# Patient Record
Sex: Female | Born: 1935 | Race: White | Hispanic: No | Smoking: Former smoker
Health system: Southern US, Community
[De-identification: ages and names within clinical notes are randomized; demographics above are authoritative.]

## PROBLEM LIST (undated history)

## (undated) DIAGNOSIS — J449 Chronic obstructive pulmonary disease, unspecified: Secondary | ICD-10-CM

## (undated) DIAGNOSIS — E7800 Pure hypercholesterolemia, unspecified: Secondary | ICD-10-CM

## (undated) DIAGNOSIS — F419 Anxiety disorder, unspecified: Secondary | ICD-10-CM

## (undated) DIAGNOSIS — M1990 Unspecified osteoarthritis, unspecified site: Secondary | ICD-10-CM

## (undated) DIAGNOSIS — F0280 Dementia in other diseases classified elsewhere without behavioral disturbance: Secondary | ICD-10-CM

## (undated) DIAGNOSIS — G309 Alzheimer's disease, unspecified: Secondary | ICD-10-CM

## (undated) HISTORY — PX: BLADDER REPAIR: SHX76

---

## 2016-06-03 ENCOUNTER — Other Ambulatory Visit (HOSPITAL_COMMUNITY): Payer: Self-pay | Admitting: Pulmonary Disease

## 2016-06-03 DIAGNOSIS — R413 Other amnesia: Secondary | ICD-10-CM

## 2016-06-05 ENCOUNTER — Encounter (HOSPITAL_COMMUNITY): Payer: Self-pay | Admitting: Radiology

## 2016-06-05 ENCOUNTER — Ambulatory Visit (HOSPITAL_COMMUNITY)
Admission: RE | Admit: 2016-06-05 | Discharge: 2016-06-05 | Disposition: A | Discharge status: Home / Self Care | Payer: Medicare PPO | Source: Ambulatory Visit | Attending: Pulmonary Disease | Admitting: Pulmonary Disease

## 2016-06-05 DIAGNOSIS — Q048 Other specified congenital malformations of brain: Secondary | ICD-10-CM | POA: Insufficient documentation

## 2016-06-05 DIAGNOSIS — R413 Other amnesia: Secondary | ICD-10-CM

## 2016-06-05 DIAGNOSIS — G319 Degenerative disease of nervous system, unspecified: Secondary | ICD-10-CM | POA: Insufficient documentation

## 2016-06-05 DIAGNOSIS — R930 Abnormal findings on diagnostic imaging of skull and head, not elsewhere classified: Secondary | ICD-10-CM | POA: Insufficient documentation

## 2016-07-12 ENCOUNTER — Emergency Department (HOSPITAL_COMMUNITY): Payer: Medicare PPO

## 2016-07-12 ENCOUNTER — Encounter (HOSPITAL_COMMUNITY): Payer: Self-pay | Admitting: Emergency Medicine

## 2016-07-12 ENCOUNTER — Inpatient Hospital Stay (HOSPITAL_COMMUNITY)
Admission: EM | Admit: 2016-07-12 | Discharge: 2016-07-16 | DRG: 193 | Disposition: A | Discharge status: Home Health | Payer: Medicare PPO | Attending: Pulmonary Disease | Admitting: Pulmonary Disease

## 2016-07-12 DIAGNOSIS — M8448XA Pathological fracture, other site, initial encounter for fracture: Secondary | ICD-10-CM | POA: Diagnosis not present

## 2016-07-12 DIAGNOSIS — J9601 Acute respiratory failure with hypoxia: Secondary | ICD-10-CM | POA: Diagnosis present

## 2016-07-12 DIAGNOSIS — F419 Anxiety disorder, unspecified: Secondary | ICD-10-CM | POA: Diagnosis present

## 2016-07-12 DIAGNOSIS — J189 Pneumonia, unspecified organism: Secondary | ICD-10-CM

## 2016-07-12 DIAGNOSIS — M1990 Unspecified osteoarthritis, unspecified site: Secondary | ICD-10-CM | POA: Diagnosis present

## 2016-07-12 DIAGNOSIS — F0280 Dementia in other diseases classified elsewhere without behavioral disturbance: Secondary | ICD-10-CM | POA: Diagnosis present

## 2016-07-12 DIAGNOSIS — J440 Chronic obstructive pulmonary disease with acute lower respiratory infection: Secondary | ICD-10-CM | POA: Diagnosis present

## 2016-07-12 DIAGNOSIS — M8438XA Stress fracture, other site, initial encounter for fracture: Secondary | ICD-10-CM | POA: Diagnosis present

## 2016-07-12 DIAGNOSIS — Z66 Do not resuscitate: Secondary | ICD-10-CM | POA: Diagnosis present

## 2016-07-12 DIAGNOSIS — R64 Cachexia: Secondary | ICD-10-CM | POA: Diagnosis present

## 2016-07-12 DIAGNOSIS — R262 Difficulty in walking, not elsewhere classified: Secondary | ICD-10-CM

## 2016-07-12 DIAGNOSIS — Z87891 Personal history of nicotine dependence: Secondary | ICD-10-CM | POA: Diagnosis not present

## 2016-07-12 DIAGNOSIS — Z79899 Other long term (current) drug therapy: Secondary | ICD-10-CM | POA: Diagnosis not present

## 2016-07-12 DIAGNOSIS — E785 Hyperlipidemia, unspecified: Secondary | ICD-10-CM | POA: Diagnosis present

## 2016-07-12 DIAGNOSIS — M25551 Pain in right hip: Secondary | ICD-10-CM | POA: Diagnosis present

## 2016-07-12 DIAGNOSIS — R52 Pain, unspecified: Secondary | ICD-10-CM

## 2016-07-12 DIAGNOSIS — R05 Cough: Secondary | ICD-10-CM

## 2016-07-12 DIAGNOSIS — R059 Cough, unspecified: Secondary | ICD-10-CM

## 2016-07-12 DIAGNOSIS — Z681 Body mass index (BMI) 19 or less, adult: Secondary | ICD-10-CM

## 2016-07-12 DIAGNOSIS — E43 Unspecified severe protein-calorie malnutrition: Secondary | ICD-10-CM | POA: Insufficient documentation

## 2016-07-12 DIAGNOSIS — G309 Alzheimer's disease, unspecified: Secondary | ICD-10-CM | POA: Diagnosis present

## 2016-07-12 HISTORY — DX: Unspecified osteoarthritis, unspecified site: M19.90

## 2016-07-12 HISTORY — DX: Anxiety disorder, unspecified: F41.9

## 2016-07-12 HISTORY — DX: Dementia in other diseases classified elsewhere, unspecified severity, without behavioral disturbance, psychotic disturbance, mood disturbance, and anxiety: F02.80

## 2016-07-12 HISTORY — DX: Alzheimer's disease, unspecified: G30.9

## 2016-07-12 HISTORY — DX: Pure hypercholesterolemia, unspecified: E78.00

## 2016-07-12 LAB — CBC WITH DIFFERENTIAL/PLATELET
Basophils Absolute: 0 10*3/uL (ref 0.0–0.1)
Basophils Relative: 0 %
Eosinophils Absolute: 0.3 10*3/uL (ref 0.0–0.7)
Eosinophils Relative: 2 %
HCT: 35 % — ABNORMAL LOW (ref 36.0–46.0)
Hemoglobin: 11.3 g/dL — ABNORMAL LOW (ref 12.0–15.0)
Lymphocytes Relative: 21 %
Lymphs Abs: 2.6 10*3/uL (ref 0.7–4.0)
MCH: 30.4 pg (ref 26.0–34.0)
MCHC: 32.3 g/dL (ref 30.0–36.0)
MCV: 94.1 fL (ref 78.0–100.0)
Monocytes Absolute: 1 10*3/uL (ref 0.1–1.0)
Monocytes Relative: 8 %
Neutro Abs: 8.6 10*3/uL — ABNORMAL HIGH (ref 1.7–7.7)
Neutrophils Relative %: 69 %
Platelets: 342 10*3/uL (ref 150–400)
RBC: 3.72 MIL/uL — ABNORMAL LOW (ref 3.87–5.11)
RDW: 14.7 % (ref 11.5–15.5)
WBC: 12.4 10*3/uL — ABNORMAL HIGH (ref 4.0–10.5)

## 2016-07-12 LAB — BASIC METABOLIC PANEL
Anion gap: 8 (ref 5–15)
BUN: 32 mg/dL — ABNORMAL HIGH (ref 6–20)
CO2: 28 mmol/L (ref 22–32)
Calcium: 8.6 mg/dL — ABNORMAL LOW (ref 8.9–10.3)
Chloride: 102 mmol/L (ref 101–111)
Creatinine, Ser: 1.13 mg/dL — ABNORMAL HIGH (ref 0.44–1.00)
GFR calc Af Amer: 53 mL/min — ABNORMAL LOW (ref >60)
GFR calc non Af Amer: 45 mL/min — ABNORMAL LOW (ref >60)
Glucose, Bld: 107 mg/dL — ABNORMAL HIGH (ref 65–99)
Potassium: 3.5 mmol/L (ref 3.5–5.1)
Sodium: 138 mmol/L (ref 135–145)

## 2016-07-12 LAB — TROPONIN I: Troponin I: <0.03 ng/mL (ref <0.03)

## 2016-07-12 LAB — BRAIN NATRIURETIC PEPTIDE: B Natriuretic Peptide: 89 pg/mL (ref 0.0–100.0)

## 2016-07-12 MED ORDER — MORPHINE SULFATE (PF) 2 MG/ML IV SOLN
INTRAVENOUS | Status: AC
  Administered 2016-07-12: 2 mg via INTRAVENOUS
  Filled 2016-07-12: qty 1

## 2016-07-12 MED ORDER — MORPHINE SULFATE (PF) 4 MG/ML IV SOLN: 2.0000 mg | Freq: Once | INTRAVENOUS | Status: DC

## 2016-07-12 MED ORDER — DEXTROSE 5 % IV SOLN
500.0000 mg | INTRAVENOUS | Status: DC
  Administered 2016-07-12: 500 mg via INTRAVENOUS
  Filled 2016-07-12: qty 500

## 2016-07-12 MED ORDER — ENOXAPARIN SODIUM 30 MG/0.3ML ~~LOC~~ SOLN
30.0000 mg | SUBCUTANEOUS | Status: DC
  Administered 2016-07-13 – 2016-07-16 (×4): 30 mg via SUBCUTANEOUS
  Filled 2016-07-12 (×4): qty 0.3

## 2016-07-12 MED ORDER — ALBUTEROL SULFATE (2.5 MG/3ML) 0.083% IN NEBU: 2.5000 mg | INHALATION_SOLUTION | RESPIRATORY_TRACT | Status: DC | PRN

## 2016-07-12 MED ORDER — DEXTROSE 5 % IV SOLN
1.0000 g | Freq: Once | INTRAVENOUS | Status: AC
  Administered 2016-07-12: 1 g via INTRAVENOUS
  Filled 2016-07-12: qty 10

## 2016-07-12 MED ORDER — LACTATED RINGERS IV SOLN
INTRAVENOUS | Status: DC
  Administered 2016-07-13 – 2016-07-16 (×5): via INTRAVENOUS

## 2016-07-12 MED ORDER — DEXTROSE 5 % IV SOLN
1.0000 g | INTRAVENOUS | Status: DC
  Administered 2016-07-13 – 2016-07-15 (×3): 1 g via INTRAVENOUS
  Filled 2016-07-12 (×4): qty 10

## 2016-07-12 MED ORDER — HYDROCODONE-ACETAMINOPHEN 5-325 MG PO TABS
1.0000 | ORAL_TABLET | Freq: Four times a day (QID) | ORAL | Status: DC
  Administered 2016-07-13 – 2016-07-16 (×12): 1 via ORAL
  Filled 2016-07-12 (×14): qty 1

## 2016-07-12 MED ORDER — AZITHROMYCIN 250 MG PO TABS
500.0000 mg | ORAL_TABLET | ORAL | Status: DC
  Administered 2016-07-13 – 2016-07-16 (×4): 500 mg via ORAL
  Filled 2016-07-12 (×4): qty 2

## 2016-07-12 MED ORDER — MIRTAZAPINE 15 MG PO TABS
7.5000 mg | ORAL_TABLET | Freq: Every day | ORAL | Status: DC
  Administered 2016-07-13 – 2016-07-15 (×3): 7.5 mg via ORAL
  Filled 2016-07-12 (×4): qty 1

## 2016-07-12 MED ORDER — METHOCARBAMOL 500 MG PO TABS: 500.0000 mg | ORAL_TABLET | Freq: Four times a day (QID) | ORAL | Status: DC | PRN

## 2016-07-12 MED ORDER — DONEPEZIL HCL 5 MG PO TABS
5.0000 mg | ORAL_TABLET | Freq: Every day | ORAL | Status: DC
  Administered 2016-07-13 – 2016-07-16 (×4): 5 mg via ORAL
  Filled 2016-07-12 (×4): qty 1

## 2016-07-12 MED ORDER — IPRATROPIUM-ALBUTEROL 0.5-2.5 (3) MG/3ML IN SOLN
3.0000 mL | Freq: Once | RESPIRATORY_TRACT | Status: AC
  Administered 2016-07-12: 3 mL via RESPIRATORY_TRACT
  Filled 2016-07-12: qty 3

## 2016-07-12 MED ORDER — ALPRAZOLAM 0.5 MG PO TABS
0.5000 mg | ORAL_TABLET | Freq: Four times a day (QID) | ORAL | Status: DC
  Administered 2016-07-13 – 2016-07-14 (×4): 0.5 mg via ORAL
  Filled 2016-07-12 (×6): qty 1

## 2016-07-12 MED ORDER — ATORVASTATIN CALCIUM 20 MG PO TABS
20.0000 mg | ORAL_TABLET | Freq: Every day | ORAL | Status: DC
  Administered 2016-07-13 – 2016-07-15 (×3): 20 mg via ORAL
  Filled 2016-07-12 (×3): qty 1

## 2016-07-12 MED ORDER — IPRATROPIUM-ALBUTEROL 0.5-2.5 (3) MG/3ML IN SOLN
3.0000 mL | Freq: Four times a day (QID) | RESPIRATORY_TRACT | Status: DC
  Administered 2016-07-13 – 2016-07-14 (×5): 3 mL via RESPIRATORY_TRACT
  Filled 2016-07-12 (×5): qty 3

## 2016-07-12 NOTE — ED Provider Notes (Signed)
[facility]-EM[facility]Y DEPT Provider Note   CSN: [id number] Arrival date & time: 07/12/16  1707  By signing my name below, I, Rosario Adie, attest that this documentation has been prepared under the direction and in the presence of Raeford Razor, MD. Electronically Signed: Rosario Adie, ED Scribe. 07/12/16. 6:07 PM.  History   Chief Complaint Chief Complaint  Patient presents with  . Weakness  . Nasal Congestion   The history is provided by the patient. No language interpreter was used.    HPI Comments: Melissa Davila is a 81 y.o. female with a h/o alzheimer's dementia and arthritis, who presents to the Emergency Department complaining of persistent, gradually worsening productive cough w/ green sputum onset approximately one week ago. Pt reports associated shortness of breath, chest congestion, loss of appetite, and generalized weakness secondary to her worsening cough. No noted treatments for her symptoms were tried prior to coming into the ED. Pt is not O2 dependent at home. Pt is a former smoker, and family notes that she quit ~1 year ago. Pt denies chest pain, fever, leg swelling, urgency, frequency, hematuria, dysuria, difficulty urinating, or any other associated symptoms.   Family additionally states that the pt has been c/o gradually worsening right hip pain over the past three days as well. No recent trauma or falls. Pt has been minimally ambulatory since secondary to it exacerbating her pain.   PCP: Fredirick Maudlin, MD  Past Medical History:  Diagnosis Date  . Alzheimer's dementia   . Anxiety   . Arthritis   . Hypercholesterolemia    There are no active problems to display for this patient.  Past Surgical History:  Procedure Laterality Date  . BLADDER REPAIR     OB History    No data available     Home Medications    Prior to Admission medications   Not on File   Family History History reviewed. No pertinent family history.  Social History Social  History  Substance Use Topics  . Smoking status: Former Games developer  . Smokeless tobacco: Never Used  . Alcohol use No   Allergies   Patient has no allergy information on record.  Review of Systems Review of Systems  Constitutional: Positive for appetite change. Negative for fever.  HENT: Positive for congestion.   Respiratory: Positive for cough and shortness of breath.   Cardiovascular: Negative for chest pain and leg swelling.  Genitourinary: Negative for difficulty urinating, dysuria, frequency, hematuria and urgency.  Musculoskeletal: Positive for arthralgias (right hip).  Neurological: Positive for weakness (generalized).  All other systems reviewed and are negative.  Physical Exam Updated Vital Signs BP 126/56   Pulse 92   Temp 98.3 F (36.8 C) (Oral)   Resp 23   Ht 5\' 6"  (1.676 m)   Wt 86 lb (39 kg)   LMP  (LMP Unknown)   SpO2 91%   BMI 13.88 kg/m   Physical Exam  Constitutional:  Cachectic. Chronically ill appearing.   HENT:  Head: Normocephalic.  Right Ear: External ear normal.  Left Ear: External ear normal.  Nose: Nose normal.  Mouth/Throat: Oropharynx is clear and moist.  Eyes: Conjunctivae are normal. Right eye exhibits no discharge. Left eye exhibits no discharge.  Neck: Normal range of motion.  Cardiovascular: Normal rate, regular rhythm and normal heart sounds.   No murmur heard. Pulmonary/Chest: Effort normal. No respiratory distress. She has no wheezes. She has rhonchi.  Wet sounding cough on exam. Bilateral rhonchi is noted, R>L.  Abdominal: Soft. She exhibits no distension. There is no tenderness. There is no rebound and no guarding.  Musculoskeletal: She exhibits tenderness. She exhibits no edema or deformity.  RLE: Holds RLE with her hip slightly flexed. TTP over greater trochanter. No obvious deformity. Increased pain on ROM. Neurovascularly intact.   Neurological: She is alert. No cranial nerve deficit. Coordination normal.  Skin: Skin is warm  and dry. No rash noted. No erythema.  Psychiatric: She has a normal mood and affect. Her behavior is normal.  Nursing note and vitals reviewed.  ED Treatments / Results  DIAGNOSTIC STUDIES: Oxygen Saturation is 91% on 2LO2NC, low by my interpretation.   COORDINATION OF CARE: 6:04 PM-Discussed next steps with pt and family. Pt and family verbalized understanding and is agreeable with the plan.   Labs (all labs ordered are listed, but only abnormal results are displayed) Labs Reviewed  CBC WITH DIFFERENTIAL/PLATELET - Abnormal; Notable for the following:       Result Value   WBC 12.4 (*)    RBC 3.72 (*)    Hemoglobin 11.3 (*)    HCT 35.0 (*)    Neutro Abs 8.6 (*)    All other components within normal limits  BASIC METABOLIC PANEL - Abnormal; Notable for the following:    Glucose, Bld 107 (*)    BUN 32 (*)    Creatinine, Ser 1.13 (*)    Calcium 8.6 (*)    GFR calc non Af Amer 45 (*)    GFR calc Af Amer 53 (*)    All other components within normal limits  SEDIMENTATION RATE - Abnormal; Notable for the following:    Sed Rate 78 (*)    All other components within normal limits  C-REACTIVE PROTEIN - Abnormal; Notable for the following:    CRP 16.5 (*)    All other components within normal limits  BASIC METABOLIC PANEL - Abnormal; Notable for the following:    Potassium 3.2 (*)    Glucose, Bld 138 (*)    BUN 26 (*)    Calcium 7.9 (*)    GFR calc non Af Amer 58 (*)    All other components within normal limits  CBC WITH DIFFERENTIAL/PLATELET - Abnormal; Notable for the following:    RBC 3.18 (*)    Hemoglobin 9.7 (*)    HCT 30.1 (*)    All other components within normal limits  CULTURE, BLOOD (ROUTINE X 2)  CULTURE, BLOOD (ROUTINE X 2)  BRAIN NATRIURETIC PEPTIDE  TROPONIN I  STREP PNEUMONIAE URINARY ANTIGEN  URINALYSIS, ROUTINE W REFLEX MICROSCOPIC    EKG  EKG Interpretation  Date/Time:  Sunday July 12 2016 18:10:01 EST Ventricular Rate:  92 PR Interval:      QRS Duration: 77 QT Interval:  334 QTC Calculation: 414 R Axis:   64 Text Interpretation:  Sinus rhythm Prolonged PR interval Consider left atrial enlargement Anterior infarct, old Confirmed by Juleen China  MD, Roena Sassaman ([id number]) on 07/12/2016 6:40:16 PM       Radiology No results found.   Dg Chest 1 View  Result Date: 07/12/2016 CLINICAL DATA:  Productive cough and congestion for 1 week. EXAM: CHEST 1 VIEW COMPARISON:  None. FINDINGS: There is marked hyperinflation. There is patchy opacity in the left base which may represent infectious infiltrate. There is severe emphysematous and fibrotic disease throughout both lungs. No large effusion. Hilar and mediastinal contours are unremarkable. Pulmonary vasculature is normal. No pneumothorax. IMPRESSION: Patchy airspace opacity in the left base may  represent pneumonia superimposed on severe COPD. Followup PA and lateral chest X-ray is recommended in 3-4 weeks following trial of antibiotic therapy to ensure resolution and exclude underlying malignancy. Electronically Signed   By: Ellery Plunkaniel R Mitchell M.D.   On: 07/12/2016 20:31   Mr Hip Right Wo Contrast  Result Date: 07/13/2016 CLINICAL DATA:  Right hip pain.  No known injury. EXAM: MR OF THE RIGHT HIP WITHOUT CONTRAST TECHNIQUE: Multiplanar, multisequence MR imaging was performed. No intravenous contrast was administered. COMPARISON:  Radiographs dated 07/12/2016 FINDINGS: Bones: There is a subtle stress fracture/reaction in the right sacral ala adjacent to the SI joint. The bones of the right hip are normal. Articular cartilage and labrum Articular cartilage:  Diffuse thinning of the articular cartilage. Labrum:  Intact. Joint or bursal effusion Joint effusion:  None Bursae:  Normal Muscles and tendons Muscles and tendons: Slight edema in the adductor muscles bilaterally adjacent to the symphysis pubis. This is symmetrical. Other findings Miscellaneous: No adenopathy or mass lesions. Diverticulosis of the distal  colon. IMPRESSION: 1. Subtle stress fracture/reaction of the right sacral ala. 2. Nonspecific edema and adductor muscles adjacent to the symphysis pubis bilaterally. Electronically Signed   By: Francene BoyersJames  Maxwell M.D.   On: 07/13/2016 12:16   Dg Hip Unilat With Pelvis 2-3 Views Right  Result Date: 07/12/2016 CLINICAL DATA:  81 y/o  F; 3 days of right hip pain without trauma. EXAM: DG HIP (WITH OR WITHOUT PELVIS) 2-3V RIGHT COMPARISON:  None. FINDINGS: Bones are demineralized. Mild osteoarthrosis of hip joints with productive changes of the acetabulum. The no acute fracture or dislocation is identified. Aortic and bilateral iliofemoral atherosclerosis with extensive calcification. IMPRESSION: No acute fracture or dislocation identified. Electronically Signed   By: Mitzi HansenLance  Furusawa-Stratton M.D.   On: 07/12/2016 20:33   Procedures Procedures   Medications Ordered in ED Medications - No data to display  Initial Impression / Assessment and Plan / ED Course  I have reviewed the triage vital signs and the nursing notes.  Pertinent labs & imaging results that were available during my care of the patient were reviewed by me and considered in my medical decision making (see chart for details).     Final Clinical Impressions(s) / ED Diagnoses   Final diagnoses:  Community acquired pneumonia of left lung, unspecified part of lung  Hip pain, acute, right    New Prescriptions New Prescriptions   No medications on file   I personally preformed the services scribed in my presence. The recorded information has been reviewed is accurate. Raeford RazorStephen Livana Yerian, MD.    Raeford RazorStephen Madden Garron, MD 07/22/16 [phone]

## 2016-07-12 NOTE — ED Triage Notes (Signed)
Congestion with cough x 1 week, green sputum. r hip pain x 3 days, denies fall. Lives with daughter who says pt has beginning stages of alzheimer's. Pt denies fall.

## 2016-07-12 NOTE — ED Notes (Signed)
Report given to Delene RuffiniSilivia, RN on Dept 300, all questions answered

## 2016-07-12 NOTE — ED Notes (Signed)
Resp paged for breathing treatment.  

## 2016-07-12 NOTE — H&P (Addendum)
History and Physical    [name] YKZ:[id number] DOB: Jan 26, 1936 DOA: 07/12/2016  PCP: Alonza Bogus, MD Consultants:  None Patient coming from: Home - lives with daughter, son-in-law, and granddaughter; Melissa Davila: daughter, [phone]  Chief Complaint: hip pain, cough  HPI: Melissa Davila is a 81 y.o. female with medical history significant of HLD, mild dementia presenting with right hip pain.  She reports that her daughter made her come.  "Feel real bad, just don't feel well."  5 days of fatigue, difficulty "going".  Right leg pain.  Early dementia.  +cough, nonproductive.  No fever.  No other URI symptoms.  Just very inactive since Thursday.  Hip pain since then.  Just generally immobile, only up to go to restroom twice a day since Friday.  Unable to stand at all.  Washed dishes Thursday afternoon and was in her usual state of health.  No apparent injury, no fall.  Just acute onset of inability to move R hip, but able to move foot.     ED Course: Given Duoneb, morphine, Rocephin, Azithromycin  Review of Systems: As per HPI; otherwise 10 point review of systems reviewed and negative.   Ambulatory Status:  Able to ambulate with rolling walker Thursday without difficulty  Past Medical History:  Diagnosis Date  . Alzheimer's dementia   . Anxiety   . Arthritis   . Hypercholesterolemia     Past Surgical History:  Procedure Laterality Date  . BLADDER REPAIR      Social History   Social History  . Marital status: Divorced    Spouse name: N/A  . Number of children: N/A  . Years of education: N/A   Occupational History  . retired    Social History Main Topics  . Smoking status: Former Smoker    Packs/day: 1.00    Years: 50.00    Quit date: 06/2015  . Smokeless tobacco: Never Used  . Alcohol use No  . Drug use: No  . Sexual activity: Not on file   Other Topics Concern  . Not on file   Social History Narrative  . No narrative on file    Not on File  History  reviewed. No pertinent family history.  Prior to Admission medications   Medication Sig Start Date End Date Taking? Authorizing Provider  ALPRAZolam Duanne Moron) 0.5 MG tablet Take 1 tablet by mouth 4 (four) times daily. 06/17/16  Yes Historical Provider, MD  atorvastatin (LIPITOR) 20 MG tablet Take 1 tablet by mouth daily at 6 PM. 06/23/16  Yes Historical Provider, MD  donepezil (ARICEPT) 5 MG tablet Take 1 tablet by mouth daily. 07/10/16  Yes Historical Provider, MD  HYDROcodone-acetaminophen (NORCO/VICODIN) 5-325 MG tablet Take 1 tablet by mouth every 6 (six) hours. 06/17/16  Yes Historical Provider, MD  mirtazapine (REMERON) 7.5 MG tablet Take 1 tablet by mouth daily. 07/10/16  Yes Historical Provider, MD    Physical Exam: Vitals:   07/12/16 1923 07/12/16 1930 07/12/16 2137 07/12/16 2317  BP:  113/67 132/94 (!) 117/46  Pulse:  79 85 85  Resp:  _0 Temp:   98.1 F (36.7 C) 99.5 F (37.5 C)  TempSrc:   Oral Oral  SpO2: 97% 98% 94% 94%  Weight:    36.5 kg (80 lb 7.5 oz)  Height:    _1  (1.676 m)     General: Appears calm and comfortable and is NAD, quite frail and cachectic Eyes:  PERRL, EOMI, normal lids, iris ENT:  grossly  normal hearing, lips & tongue, mmm Neck:  no LAD, masses or thyromegaly Cardiovascular:  RRR, no m/r/g. No LE edema.  Respiratory:  CTA bilaterally, no w/r/r. Normal respiratory effort. Abdomen:  soft, ntnd, NABS Skin:  no rash or induration seen on limited exam Musculoskeletal:  grossly normal tone BUE/BLE, no bony abnormality, marked tenderness with movement (no apparent joint involvement) in left gluteal region Psychiatric:  grossly normal mood and affect, speech fluent and appropriate, AOx3 Neurologic:  CN 2-12 grossly intact, moves all extremities in coordinated fashion, sensation intact  Labs on Admission: I have personally reviewed following labs and imaging studies  CBC:  Recent Labs Lab 07/12/16 1812  WBC 12.4*  NEUTROABS 8.6*  HGB 11.3*  HCT  35.0*  MCV 94.1  PLT 161   Basic Metabolic Panel:  Recent Labs Lab 07/12/16 1812  NA 138  K 3.5  CL 102  CO2 28  GLUCOSE 107*  BUN 32*  CREATININE 1.13*  CALCIUM 8.6*   GFR: Estimated Creatinine Clearance: 23.6 mL/min (by C-G formula based on SCr of 1.13 mg/dL (H)). Liver Function Tests: No results for input(s): AST, ALT, ALKPHOS, BILITOT, PROT, ALBUMIN in the last 168 hours. No results for input(s): LIPASE, AMYLASE in the last 168 hours. No results for input(s): AMMONIA in the last 168 hours. Coagulation Profile: No results for input(s): INR, PROTIME in the last 168 hours. Cardiac Enzymes:  Recent Labs Lab 07/12/16 1812  TROPONINI <0.03   BNP (last 3 results) No results for input(s): PROBNP in the last 8760 hours. HbA1C: No results for input(s): HGBA1C in the last 72 hours. CBG: No results for input(s): GLUCAP in the last 168 hours. Lipid Profile: No results for input(s): CHOL, HDL, LDLCALC, TRIG, CHOLHDL, LDLDIRECT in the last 72 hours. Thyroid Function Tests: No results for input(s): TSH, T4TOTAL, FREET4, T3FREE, THYROIDAB in the last 72 hours. Anemia Panel: No results for input(s): VITAMINB12, FOLATE, FERRITIN, TIBC, IRON, RETICCTPCT in the last 72 hours. Urine analysis: No results found for: COLORURINE, APPEARANCEUR, LABSPEC, PHURINE, GLUCOSEU, HGBUR, BILIRUBINUR, KETONESUR, PROTEINUR, UROBILINOGEN, NITRITE, LEUKOCYTESUR  Creatinine Clearance: Estimated Creatinine Clearance: 23.6 mL/min (by C-G formula based on SCr of 1.13 mg/dL (H)).  Sepsis Labs: _0 (procalcitonin:4,lacticidven:4) )No results found for this or any previous visit (from the past 240 hour(s)).   Radiological Exams on Admission: Dg Chest 1 View  Result Date: 07/12/2016 CLINICAL DATA:  Productive cough and congestion for 1 week. EXAM: CHEST 1 VIEW COMPARISON:  None. FINDINGS: There is marked hyperinflation. There is patchy opacity in the left base which may represent infectious  infiltrate. There is severe emphysematous and fibrotic disease throughout both lungs. No large effusion. Hilar and mediastinal contours are unremarkable. Pulmonary vasculature is normal. No pneumothorax. IMPRESSION: Patchy airspace opacity in the left base may represent pneumonia superimposed on severe COPD. Followup PA and lateral chest X-ray is recommended in 3-4 weeks following trial of antibiotic therapy to ensure resolution and exclude underlying malignancy. Electronically Signed   By: Andreas Newport M.D.   On: 07/12/2016 20:31   Dg Hip Unilat With Pelvis 2-3 Views Right  Result Date: 07/12/2016 CLINICAL DATA:  81 y/o  F; 3 days of right hip pain without trauma. EXAM: DG HIP (WITH OR WITHOUT PELVIS) 2-3V RIGHT COMPARISON:  None. FINDINGS: Bones are demineralized. Mild osteoarthrosis of hip joints with productive changes of the acetabulum. The no acute fracture or dislocation is identified. Aortic and bilateral iliofemoral atherosclerosis with extensive calcification. IMPRESSION: No acute fracture or dislocation identified. Electronically Signed  By: Kristine Garbe M.D.   On: 07/12/2016 20:33    EKG: Independently reviewed.  NSR with rate 92; nonspecific ST changes with no evidence of acute ischemia  Assessment/Plan Principal Problem:   Hip pain, acute, right Active Problems:   CAP (community acquired pneumonia)   Hip pain -Patient with acute onset of right hip pain since Thursday night -Had been her usual self and had washed dishes on Thursday without incident prior to onset of pain -Denies injury or fall -Negative x-ray -Exam appears to show no TTP but pain with movement in the right gluteal region -Suspect MSK strain -Will treat with morphine and Robaxin prn -PT consult -Check ESR, CRP due to acute onset of severe R hip pain.  CAP -Prolonged smoking history -Also with incidental cough -CXR with possible PNA -Will continue Azithromycin 500 mg PO daily AND  Rocephin. -LR @ 75cc/hr - Repeat CBC in am (WBC 12.4) - Sputum cultures - Blood cultures - albuterol PRN - Duonebs q6h    -Creatinine 1.13, unknown baseline.  Little muscle mass.  Will follow. -Hgb 11.3, unknown baseline. -Cachectic, will order nutrition consult.  DVT prophylaxis:   Lovenox  Code Status: DNR - confirmed with patient/family Family Communication: Daughters present throughout evaluation Disposition Plan:  Home once clinically improved Consults called: PT, Nutrition Admission status: Admit - It is my clinical opinion that admission to INPATIENT is reasonable and necessary because this patient will require at least 2 midnights in the hospital to treat this condition based on the medical complexity of the problems presented.  Given the aforementioned information, the predictability of an adverse outcome is felt to be significant.     Karmen Bongo MD [facility]  If 7PM-7AM, please contact night-coverage [email or web address] Password [facility]  07/12/2016, 11:24 PM

## 2016-07-13 ENCOUNTER — Inpatient Hospital Stay (HOSPITAL_COMMUNITY): Payer: Medicare PPO

## 2016-07-13 DIAGNOSIS — M8448XA Pathological fracture, other site, initial encounter for fracture: Secondary | ICD-10-CM

## 2016-07-13 LAB — CBC WITH DIFFERENTIAL/PLATELET
BASOS ABS: 0 10*3/uL (ref 0.0–0.1)
BASOS PCT: 0 %
Eosinophils Absolute: 0.2 10*3/uL (ref 0.0–0.7)
Eosinophils Relative: 3 %
HEMATOCRIT: 30.1 % — AB (ref 36.0–46.0)
Hemoglobin: 9.7 g/dL — ABNORMAL LOW (ref 12.0–15.0)
LYMPHS PCT: 27 %
Lymphs Abs: 2.4 10*3/uL (ref 0.7–4.0)
MCH: 30.5 pg (ref 26.0–34.0)
MCHC: 32.2 g/dL (ref 30.0–36.0)
MCV: 94.7 fL (ref 78.0–100.0)
Monocytes Absolute: 0.9 10*3/uL (ref 0.1–1.0)
Monocytes Relative: 10 %
NEUTROS ABS: 5.4 10*3/uL (ref 1.7–7.7)
NEUTROS PCT: 60 %
Platelets: 319 10*3/uL (ref 150–400)
RBC: 3.18 MIL/uL — AB (ref 3.87–5.11)
RDW: 14.6 % (ref 11.5–15.5)
WBC: 9 10*3/uL (ref 4.0–10.5)

## 2016-07-13 LAB — BASIC METABOLIC PANEL
ANION GAP: 7 (ref 5–15)
BUN: 26 mg/dL — ABNORMAL HIGH (ref 6–20)
CALCIUM: 7.9 mg/dL — AB (ref 8.9–10.3)
CO2: 29 mmol/L (ref 22–32)
Chloride: 104 mmol/L (ref 101–111)
Creatinine, Ser: 0.92 mg/dL (ref 0.44–1.00)
GFR, EST NON AFRICAN AMERICAN: 58 mL/min — AB (ref >60)
GLUCOSE: 138 mg/dL — AB (ref 65–99)
POTASSIUM: 3.2 mmol/L — AB (ref 3.5–5.1)
SODIUM: 140 mmol/L (ref 135–145)

## 2016-07-13 LAB — C-REACTIVE PROTEIN: CRP: 16.5 mg/dL — AB (ref <1.0)

## 2016-07-13 LAB — SEDIMENTATION RATE: SED RATE: 78 mm/h — AB (ref 0–22)

## 2016-07-13 MED ORDER — ADULT MULTIVITAMIN W/MINERALS CH
1.0000 | ORAL_TABLET | Freq: Every day | ORAL | Status: DC
  Administered 2016-07-13 – 2016-07-16 (×4): 1 via ORAL
  Filled 2016-07-13 (×4): qty 1

## 2016-07-13 MED ORDER — ENSURE ENLIVE PO LIQD
237.0000 mL | Freq: Two times a day (BID) | ORAL | Status: DC
  Administered 2016-07-13 – 2016-07-16 (×7): 237 mL via ORAL

## 2016-07-13 MED ORDER — PREDNISONE 20 MG PO TABS
40.0000 mg | ORAL_TABLET | Freq: Every day | ORAL | Status: DC
  Administered 2016-07-13 – 2016-07-16 (×4): 40 mg via ORAL
  Filled 2016-07-13 (×4): qty 2

## 2016-07-13 NOTE — Consult Note (Addendum)
Reason for Consult: Pain right hip acute in onset  Referring Physician: [name] is an 81 y.o. female.  HPI:  81 year old female acute onset of right hip pain severe radiating to right thigh posterior laterally with no radiation into the foot no groin pain no history of hip arthritis hip pain or back pain  Pain came on suddenly within the last 24 hours.   Past Medical History:  Diagnosis Date  . Alzheimer's dementia   . Anxiety   . Arthritis   . Hypercholesterolemia     Past Surgical History:  Procedure Laterality Date  . BLADDER REPAIR      History reviewed. No pertinent family history.  Social History:  reports that she quit smoking about 12 months ago. She has a 50.00 pack-year smoking history. She has never used smokeless tobacco. She reports that she does not drink alcohol or use drugs.  Allergies: No Known Allergies  Medications: I have reviewed the patient's current medications.  Results for orders placed or performed during the hospital encounter of 07/12/16 (from the past 48 hour(s))  Brain natriuretic peptide     Status: None   Collection Time: 07/12/16  6:12 PM  Result Value Ref Range   B Natriuretic Peptide 89.0 0.0 - 100.0 pg/mL  CBC with Differential     Status: Abnormal   Collection Time: 07/12/16  6:12 PM  Result Value Ref Range   WBC 12.4 (H) 4.0 - 10.5 K/uL   RBC 3.72 (L) 3.87 - 5.11 MIL/uL   Hemoglobin 11.3 (L) 12.0 - 15.0 g/dL   HCT 35.0 (L) 36.0 - 46.0 %   MCV 94.1 78.0 - 100.0 fL   MCH 30.4 26.0 - 34.0 pg   MCHC 32.3 30.0 - 36.0 g/dL   RDW 14.7 11.5 - 15.5 %   Platelets 342 150 - 400 K/uL   Neutrophils Relative % 69 %   Neutro Abs 8.6 (H) 1.7 - 7.7 K/uL   Lymphocytes Relative 21 %   Lymphs Abs 2.6 0.7 - 4.0 K/uL   Monocytes Relative 8 %   Monocytes Absolute 1.0 0.1 - 1.0 K/uL   Eosinophils Relative 2 %   Eosinophils Absolute 0.3 0.0 - 0.7 K/uL   Basophils Relative 0 %   Basophils Absolute 0.0 0.0 - 0.1 K/uL  Basic  metabolic panel     Status: Abnormal   Collection Time: 07/12/16  6:12 PM  Result Value Ref Range   Sodium 138 135 - 145 mmol/L   Potassium 3.5 3.5 - 5.1 mmol/L   Chloride 102 101 - 111 mmol/L   CO2 28 22 - 32 mmol/L   Glucose, Bld 107 (H) 65 - 99 mg/dL   BUN 32 (H) 6 - 20 mg/dL   Creatinine, Ser 1.13 (H) 0.44 - 1.00 mg/dL   Calcium 8.6 (L) 8.9 - 10.3 mg/dL   GFR calc non Af Amer 45 (L) >60 mL/min   GFR calc Af Amer 53 (L) >60 mL/min    Comment: (NOTE) The eGFR has been calculated using the CKD EPI equation. This calculation has not been validated in all clinical situations. eGFR's persistently <60 mL/min signify possible Chronic Kidney Disease.    Anion gap 8 5 - 15  Troponin I     Status: None   Collection Time: 07/12/16  6:12 PM  Result Value Ref Range   Troponin I <0.03 <0.03 ng/mL  Sedimentation rate     Status: Abnormal   Collection Time: 07/13/16  12:37 AM  Result Value Ref Range   Sed Rate 78 (H) 0 - 22 mm/hr  Culture, blood (routine x 2) Call MD if unable to obtain prior to antibiotics being given     Status: None (Preliminary result)   Collection Time: 07/13/16 12:37 AM  Result Value Ref Range   Specimen Description BLOOD RIGHT ARM    Special Requests BOTTLES DRAWN AEROBIC AND ANAEROBIC 8CC EACH    Culture NO GROWTH < 12 HOURS    Report Status PENDING   Culture, blood (routine x 2) Call MD if unable to obtain prior to antibiotics being given     Status: None (Preliminary result)   Collection Time: 07/13/16 12:48 AM  Result Value Ref Range   Specimen Description BLOOD RIGHT HAND    Special Requests BOTTLES DRAWN AEROBIC AND ANAEROBIC 8CC EACH    Culture NO GROWTH < 12 HOURS    Report Status PENDING   Basic metabolic panel     Status: Abnormal   Collection Time: 07/13/16  5:55 AM  Result Value Ref Range   Sodium 140 135 - 145 mmol/L   Potassium 3.2 (L) 3.5 - 5.1 mmol/L   Chloride 104 101 - 111 mmol/L   CO2 29 22 - 32 mmol/L   Glucose, Bld 138 (H) 65 - 99 mg/dL    BUN 26 (H) 6 - 20 mg/dL   Creatinine, Ser 0.92 0.44 - 1.00 mg/dL   Calcium 7.9 (L) 8.9 - 10.3 mg/dL   GFR calc non Af Amer 58 (L) >60 mL/min   GFR calc Af Amer >60 >60 mL/min    Comment: (NOTE) The eGFR has been calculated using the CKD EPI equation. This calculation has not been validated in all clinical situations. eGFR's persistently <60 mL/min signify possible Chronic Kidney Disease.    Anion gap 7 5 - 15  CBC WITH DIFFERENTIAL     Status: Abnormal   Collection Time: 07/13/16  5:55 AM  Result Value Ref Range   WBC 9.0 4.0 - 10.5 K/uL   RBC 3.18 (L) 3.87 - 5.11 MIL/uL   Hemoglobin 9.7 (L) 12.0 - 15.0 g/dL   HCT 30.1 (L) 36.0 - 46.0 %   MCV 94.7 78.0 - 100.0 fL   MCH 30.5 26.0 - 34.0 pg   MCHC 32.2 30.0 - 36.0 g/dL   RDW 14.6 11.5 - 15.5 %   Platelets 319 150 - 400 K/uL   Neutrophils Relative % 60 %   Neutro Abs 5.4 1.7 - 7.7 K/uL   Lymphocytes Relative 27 %   Lymphs Abs 2.4 0.7 - 4.0 K/uL   Monocytes Relative 10 %   Monocytes Absolute 0.9 0.1 - 1.0 K/uL   Eosinophils Relative 3 %   Eosinophils Absolute 0.2 0.0 - 0.7 K/uL   Basophils Relative 0 %   Basophils Absolute 0.0 0.0 - 0.1 K/uL    Dg Chest 1 View  Result Date: 07/12/2016 CLINICAL DATA:  Productive cough and congestion for 1 week. EXAM: CHEST 1 VIEW COMPARISON:  None. FINDINGS: There is marked hyperinflation. There is patchy opacity in the left base which may represent infectious infiltrate. There is severe emphysematous and fibrotic disease throughout both lungs. No large effusion. Hilar and mediastinal contours are unremarkable. Pulmonary vasculature is normal. No pneumothorax. IMPRESSION: Patchy airspace opacity in the left base may represent pneumonia superimposed on severe COPD. Followup PA and lateral chest X-ray is recommended in 3-4 weeks following trial of antibiotic therapy to ensure resolution and exclude underlying  malignancy. Electronically Signed   By: Andreas Newport M.D.   On: 07/12/2016 20:31    Dg Hip Unilat With Pelvis 2-3 Views Right  Result Date: 07/12/2016 CLINICAL DATA:  81 y/o  F; 3 days of right hip pain without trauma. EXAM: DG HIP (WITH OR WITHOUT PELVIS) 2-3V RIGHT COMPARISON:  None. FINDINGS: Bones are demineralized. Mild osteoarthrosis of hip joints with productive changes of the acetabulum. The no acute fracture or dislocation is identified. Aortic and bilateral iliofemoral atherosclerosis with extensive calcification. IMPRESSION: No acute fracture or dislocation identified. Electronically Signed   By: Kristine Garbe M.D.   On: 07/12/2016 20:33    ROS Blood pressure 96/60, pulse 75, temperature 98.3 F (36.8 C), temperature source Oral, resp. rate 20, height _0  (1.676 m), weight 80 lb 7.5 oz (36.5 kg), SpO2 96 %. Physical Exam small framed woman well developed well nourished oriented 3 mood pleasant affect normal gait she is confined to bed at this time  She has tenderness over the right posterior portion of her back and right buttock with painless range of motion in the right hip no hip instability normal muscle tone skin good distal pulse normal sensation no lymphadenopathy normal left lower extremity range of motion stability strength and alignment   Assessment/Plan: MRI shows a stress fracture of the right sacral alar which could explain her right sided buttock pain and radiation into the right hip and thigh  Recommend pain medication and weight-bear as tolerated with physical therapy  Fracture should heal over a 6 week period  Assessment of her bone density is probably needed for long-term treatment Arther Abbott 07/13/2016, 11:39 AM

## 2016-07-13 NOTE — Progress Notes (Signed)
Subjective: She was admitted yesterday with pneumonia and hip pain. Hip x-ray did not show anything. She has not fallen. She has dementia at baseline so her history may not be reliable. She's complaining of cough and congestion.  Objective: Vital signs in last 24 hours: Temp:  [98.1 F (36.7 C)-99.5 F (37.5 C)] 98.3 F (36.8 C) (01/29 0636) Pulse Rate:  [75-92] 75 (01/29 0636) Resp:  [15-23] 20 (01/29 0636) BP: (96-132)/(46-94) 96/60 (01/29 0646) SpO2:  [90 %-98 %] 96 % (01/29 0737) Weight:  [36.5 kg (80 lb 7.5 oz)-39 kg (86 lb)] 36.5 kg (80 lb 7.5 oz) (01/28 2317) Weight change:  Last BM Date: 07/11/16  Intake/Output from previous day: 01/28 0701 - 01/29 0700 In: 303.8 [I.V.:303.8] Out: -   PHYSICAL EXAM General appearance: alert, cooperative and mild distress Resp: rhonchi bilaterally Cardio: regular rate and rhythm, S1, S2 normal, no murmur, click, rub or gallop GI: soft, non-tender; bowel sounds normal; no masses,  no organomegaly Extremities: Tender in the right hip Skin warm and dry. She is very thin  Lab Results:  Results for orders placed or performed during the hospital encounter of 07/12/16 (from the past 48 hour(s))  Brain natriuretic peptide     Status: None   Collection Time: 07/12/16  6:12 PM  Result Value Ref Range   B Natriuretic Peptide 89.0 0.0 - 100.0 pg/mL  CBC with Differential     Status: Abnormal   Collection Time: 07/12/16  6:12 PM  Result Value Ref Range   WBC 12.4 (H) 4.0 - 10.5 K/uL   RBC 3.72 (L) 3.87 - 5.11 MIL/uL   Hemoglobin 11.3 (L) 12.0 - 15.0 g/dL   HCT 35.0 (L) 36.0 - 46.0 %   MCV 94.1 78.0 - 100.0 fL   MCH 30.4 26.0 - 34.0 pg   MCHC 32.3 30.0 - 36.0 g/dL   RDW 14.7 11.5 - 15.5 %   Platelets 342 150 - 400 K/uL   Neutrophils Relative % 69 %   Neutro Abs 8.6 (H) 1.7 - 7.7 K/uL   Lymphocytes Relative 21 %   Lymphs Abs 2.6 0.7 - 4.0 K/uL   Monocytes Relative 8 %   Monocytes Absolute 1.0 0.1 - 1.0 K/uL   Eosinophils Relative 2 %    Eosinophils Absolute 0.3 0.0 - 0.7 K/uL   Basophils Relative 0 %   Basophils Absolute 0.0 0.0 - 0.1 K/uL  Basic metabolic panel     Status: Abnormal   Collection Time: 07/12/16  6:12 PM  Result Value Ref Range   Sodium 138 135 - 145 mmol/L   Potassium 3.5 3.5 - 5.1 mmol/L   Chloride 102 101 - 111 mmol/L   CO2 28 22 - 32 mmol/L   Glucose, Bld 107 (H) 65 - 99 mg/dL   BUN 32 (H) 6 - 20 mg/dL   Creatinine, Ser 1.13 (H) 0.44 - 1.00 mg/dL   Calcium 8.6 (L) 8.9 - 10.3 mg/dL   GFR calc non Af Amer 45 (L) >60 mL/min   GFR calc Af Amer 53 (L) >60 mL/min    Comment: (NOTE) The eGFR has been calculated using the CKD EPI equation. This calculation has not been validated in all clinical situations. eGFR's persistently <60 mL/min signify possible Chronic Kidney Disease.    Anion gap 8 5 - 15  Troponin I     Status: None   Collection Time: 07/12/16  6:12 PM  Result Value Ref Range   Troponin I <0.03 <0.03 ng/mL  Sedimentation rate     Status: Abnormal   Collection Time: 07/13/16 12:37 AM  Result Value Ref Range   Sed Rate 78 (H) 0 - 22 mm/hr  Culture, blood (routine x 2) Call MD if unable to obtain prior to antibiotics being given     Status: None (Preliminary result)   Collection Time: 07/13/16 12:37 AM  Result Value Ref Range   Specimen Description BLOOD RIGHT ARM    Special Requests BOTTLES DRAWN AEROBIC AND ANAEROBIC 8CC EACH    Culture NO GROWTH < 12 HOURS    Report Status PENDING   Culture, blood (routine x 2) Call MD if unable to obtain prior to antibiotics being given     Status: None (Preliminary result)   Collection Time: 07/13/16 12:48 AM  Result Value Ref Range   Specimen Description BLOOD RIGHT HAND    Special Requests BOTTLES DRAWN AEROBIC AND ANAEROBIC 8CC EACH    Culture NO GROWTH < 12 HOURS    Report Status PENDING   Basic metabolic panel     Status: Abnormal   Collection Time: 07/13/16  5:55 AM  Result Value Ref Range   Sodium 140 135 - 145 mmol/L   Potassium 3.2  (L) 3.5 - 5.1 mmol/L   Chloride 104 101 - 111 mmol/L   CO2 29 22 - 32 mmol/L   Glucose, Bld 138 (H) 65 - 99 mg/dL   BUN 26 (H) 6 - 20 mg/dL   Creatinine, Ser 0.92 0.44 - 1.00 mg/dL   Calcium 7.9 (L) 8.9 - 10.3 mg/dL   GFR calc non Af Amer 58 (L) >60 mL/min   GFR calc Af Amer >60 >60 mL/min    Comment: (NOTE) The eGFR has been calculated using the CKD EPI equation. This calculation has not been validated in all clinical situations. eGFR's persistently <60 mL/min signify possible Chronic Kidney Disease.    Anion gap 7 5 - 15  CBC WITH DIFFERENTIAL     Status: Abnormal   Collection Time: 07/13/16  5:55 AM  Result Value Ref Range   WBC 9.0 4.0 - 10.5 K/uL   RBC 3.18 (L) 3.87 - 5.11 MIL/uL   Hemoglobin 9.7 (L) 12.0 - 15.0 g/dL   HCT 30.1 (L) 36.0 - 46.0 %   MCV 94.7 78.0 - 100.0 fL   MCH 30.5 26.0 - 34.0 pg   MCHC 32.2 30.0 - 36.0 g/dL   RDW 14.6 11.5 - 15.5 %   Platelets 319 150 - 400 K/uL   Neutrophils Relative % 60 %   Neutro Abs 5.4 1.7 - 7.7 K/uL   Lymphocytes Relative 27 %   Lymphs Abs 2.4 0.7 - 4.0 K/uL   Monocytes Relative 10 %   Monocytes Absolute 0.9 0.1 - 1.0 K/uL   Eosinophils Relative 3 %   Eosinophils Absolute 0.2 0.0 - 0.7 K/uL   Basophils Relative 0 %   Basophils Absolute 0.0 0.0 - 0.1 K/uL    ABGS No results for input(s): PHART, PO2ART, TCO2, HCO3 in the last 72 hours.  Invalid input(s): PCO2 CULTURES Recent Results (from the past 240 hour(s))  Culture, blood (routine x 2) Call MD if unable to obtain prior to antibiotics being given     Status: None (Preliminary result)   Collection Time: 07/13/16 12:37 AM  Result Value Ref Range Status   Specimen Description BLOOD RIGHT ARM  Final   Special Requests BOTTLES DRAWN AEROBIC AND ANAEROBIC [place]  Final   Culture NO GROWTH <  12 HOURS  Final   Report Status PENDING  Incomplete  Culture, blood (routine x 2) Call MD if unable to obtain prior to antibiotics being given     Status: None (Preliminary result)    Collection Time: 07/13/16 12:48 AM  Result Value Ref Range Status   Specimen Description BLOOD RIGHT HAND  Final   Special Requests BOTTLES DRAWN AEROBIC AND ANAEROBIC 8CC EACH  Final   Culture NO GROWTH < 12 HOURS  Final   Report Status PENDING  Incomplete   Studies/Results: Dg Chest 1 View  Result Date: 07/12/2016 CLINICAL DATA:  Productive cough and congestion for 1 week. EXAM: CHEST 1 VIEW COMPARISON:  None. FINDINGS: There is marked hyperinflation. There is patchy opacity in the left base which may represent infectious infiltrate. There is severe emphysematous and fibrotic disease throughout both lungs. No large effusion. Hilar and mediastinal contours are unremarkable. Pulmonary vasculature is normal. No pneumothorax. IMPRESSION: Patchy airspace opacity in the left base may represent pneumonia superimposed on severe COPD. Followup PA and lateral chest X-ray is recommended in 3-4 weeks following trial of antibiotic therapy to ensure resolution and exclude underlying malignancy. Electronically Signed   By: Andreas Newport M.D.   On: 07/12/2016 20:31   Dg Hip Unilat With Pelvis 2-3 Views Right  Result Date: 07/12/2016 CLINICAL DATA:  81 y/o  F; 3 days of right hip pain without trauma. EXAM: DG HIP (WITH OR WITHOUT PELVIS) 2-3V RIGHT COMPARISON:  None. FINDINGS: Bones are demineralized. Mild osteoarthrosis of hip joints with productive changes of the acetabulum. The no acute fracture or dislocation is identified. Aortic and bilateral iliofemoral atherosclerosis with extensive calcification. IMPRESSION: No acute fracture or dislocation identified. Electronically Signed   By: Kristine Garbe M.D.   On: 07/12/2016 20:33    Medications:  Prior to Admission:  Prescriptions Prior to Admission  Medication Sig Dispense Refill Last Dose  . ALPRAZolam (XANAX) 0.5 MG tablet Take 1 tablet by mouth 4 (four) times daily.   07/11/2016 at Unknown time  . atorvastatin (LIPITOR) 20 MG tablet Take  1 tablet by mouth daily at 6 PM.   07/11/2016 at Unknown time  . donepezil (ARICEPT) 5 MG tablet Take 1 tablet by mouth daily.   07/11/2016 at Unknown time  . HYDROcodone-acetaminophen (NORCO/VICODIN) 5-325 MG tablet Take 1 tablet by mouth every 6 (six) hours.   07/12/2016 at Unknown time  . mirtazapine (REMERON) 7.5 MG tablet Take 1 tablet by mouth daily.   07/11/2016 at Unknown time   Scheduled: . ALPRAZolam  0.5 mg Oral QID  . atorvastatin  20 mg Oral q1800  . azithromycin  500 mg Oral Q24H  . cefTRIAXone (ROCEPHIN)  IV  1 g Intravenous Q24H  . donepezil  5 mg Oral Daily  . enoxaparin (LOVENOX) injection  30 mg Subcutaneous Q24H  . HYDROcodone-acetaminophen  1 tablet Oral Q6H  . ipratropium-albuterol  3 mL Nebulization Q6H  . mirtazapine  7.5 mg Oral Daily  . predniSONE  40 mg Oral Q breakfast   Continuous: . lactated ringers 75 mL/hr at 07/13/16 0157   AST:[id number], methocarbamol  Assesment:She was admitted with community-acquired pneumonia and acute right hip pain. She is on appropriate treatment for the pneumonia. I'm going to order MRI to check on the hip and request orthopedic consultation. Principal Problem:   Hip pain, acute, right Active Problems:   CAP (community acquired pneumonia)    Plan: As above    LOS: 1 day   Exilda Wilhite L 07/13/2016,  9:24 AM

## 2016-07-13 NOTE — Progress Notes (Signed)
PT Cancellation Note  Patient Details Name: Melissa BogaJulia O Lobello MRN: [id number] DOB: 09/30/1935   Cancelled Treatment:    Reason Eval/Treat Not Completed: Fatigue/lethargy limiting ability to participate (Pt is sleeping upon entry, woken with gentle shaking. She reports to be exhausted and sleep deprived and would like to wait before working wth PT. Rn agreeable. ) Chart reviewed, RN consulted, pt refusing at this time due to fatigue. Pt will likely perform better close to potential when rested and alert.    Rayola Everhart C 07/13/2016, 2:54 PM  2:55 PM, 07/13/16 Rosamaria LintsAllan C Joellen Tullos, PT, DPT Physical Therapist - [place] [phone](ASCOM)  [phone] (mobile)

## 2016-07-13 NOTE — Progress Notes (Signed)
Initial Nutrition Assessment  DOCUMENTATION CODES:   Severe malnutrition in context of chronic illness  INTERVENTION:  Ensure Enlive po BID, each supplement provides 350 kcal and 20 grams of protein   Add MVI daily   Recommend OT assess self-feeding ability   NUTRITION DIAGNOSIS:   Malnutrition related to chronic illness as evidenced by severe depletion of body fat, severe depletion of muscle mass and BMI of 13.  GOAL:   Patient will meet greater than or equal to 90% of their needs   MONITOR:   Supplement acceptance, PO intake, Weight trends, Labs  REASON FOR ASSESSMENT:   Consult Assessment of nutrition requirement/status  ASSESSMENT: Patient has a hx of dementia. Presents with hip pain. Patient has felt well for about a week. She usually ambulates with a walker but recently has not been strong enough to get up. Question her self -feeding ability. Pt says she ate a sausage biscuit for breakfast this morning. Her diet is Heart Healthy and documented meal intake 50% which is fair. No previous weight hx available.   Nutrition-Focused physical exam completed. Findings are severe fat depletion (arms,thoracic region), severe muscle depletion (clavicles, dorsal, shoulders), and no edema.    Labs: potassium 3.2   Diet Order:  Diet Heart Room service appropriate? Yes; Fluid consistency: Thin  Skin:  Reviewed, no issues  Last BM:  1/27   Height:   Ht Readings from Last 1 Encounters:  07/12/16 5\' 6"  (1.676 m)    Weight:   Wt Readings from Last 1 Encounters:  07/12/16 80 lb 7.5 oz (36.5 kg)    Ideal Body Weight:  59 kg  BMI:  Body mass index is 12.99 kg/m.  Estimated Nutritional Needs:   Kcal:  1295-1480 to prevent weight loss and replete nutrition   Protein:  52-56 gr   Fluid:  1100 ml daily  EDUCATION NEEDS:   Education needs addressed   Royann ShiversLynn Graylon Amory MS,RD,CSG,LDN Office: [phone] Pager: [phone]

## 2016-07-14 DIAGNOSIS — E43 Unspecified severe protein-calorie malnutrition: Secondary | ICD-10-CM | POA: Insufficient documentation

## 2016-07-14 LAB — STREP PNEUMONIAE URINARY ANTIGEN: Strep Pneumo Urinary Antigen: NEGATIVE

## 2016-07-14 MED ORDER — IPRATROPIUM-ALBUTEROL 0.5-2.5 (3) MG/3ML IN SOLN
3.0000 mL | Freq: Three times a day (TID) | RESPIRATORY_TRACT | Status: DC
  Administered 2016-07-15 – 2016-07-16 (×3): 3 mL via RESPIRATORY_TRACT
  Filled 2016-07-14 (×5): qty 3

## 2016-07-14 MED ORDER — ALPRAZOLAM 0.5 MG PO TABS
0.5000 mg | ORAL_TABLET | Freq: Two times a day (BID) | ORAL | Status: DC
  Administered 2016-07-14 – 2016-07-16 (×4): 0.5 mg via ORAL
  Filled 2016-07-14 (×4): qty 1

## 2016-07-14 NOTE — Evaluation (Signed)
Physical Therapy Evaluation Patient Details Name: Melissa Davila MRN: [id number] DOB: 1935-11-11 Today's Date: 07/14/2016   History of Present Illness  Melissa Davila is a 81 y.o. female with medical history significant of HLD, mild dementia presenting with right hip pain.  She reports that her daughter made her come.  "Feel real bad, just don't feel well."  5 days of fatigue, difficulty "going".  Right leg pain.  Early dementia.  +cough, nonproductive.  No fever.  No other URI symptoms.  Just very inactive since Thursday.  Hip pain since then.  Just generally immobile, only up to go to restroom twice a day since Friday.  Unable to stand at all.  Washed dishes Thursday afternoon and was in her usual state of health.  No apparent injury, no fall.  Just acute onset of inability to move R hip, but able to move foot  Clinical Impression  Pt very pleasant and cooperative.  Pt bed mobility with mod I; ambulates with rolling walker with contact guard assist.   Pt lives I; therapist would recommend SNF but pt is adamantly against this.      Follow Up Recommendations Home health PT    Equipment Recommendations    none   Recommendations for Other Services   OT for ADL's    Precautions / Restrictions Precautions Precautions: None Restrictions Weight Bearing Restrictions: No      Mobility  Bed Mobility Overal bed mobility: Modified Independent                Transfers Overall transfer level: Needs assistance Equipment used: Rolling walker (2 wheeled) Transfers: Sit to/from Stand Sit to Stand: Min assist            Ambulation/Gait Ambulation/Gait assistance: Supervision Ambulation Distance (Feet): 60 Feet Assistive device: Rolling walker (2 wheeled) Gait Pattern/deviations: Decreased step length - right;Decreased step length - left   Gait velocity interpretation: <1.8 ft/sec, indicative of risk for recurrent falls    Stairs            Wheelchair Mobility    Modified  Rankin (Stroke Patients Only)       Balance                                             Pertinent Vitals/Pain Pain Assessment: No/denies pain    Home Living Family/patient expects to be discharged to:: Private residence Living Arrangements: Alone Available Help at Discharge: Family Type of Home: House Home Access: Level entry     Home Layout: One level Home Equipment: Environmental consultant - 2 wheels      Prior Function Level of Independence: Needs assistance   Gait / Transfers Assistance Needed: SBA rolling walker  ADL's / Homemaking Assistance Needed: needs assist         Hand Dominance        Extremity/Trunk Assessment        Lower Extremity Assessment Lower Extremity Assessment: Generalized weakness    Cervical / Trunk Assessment Cervical / Trunk Assessment: Kyphotic  Communication   Communication: No difficulties  Cognition Arousal/Alertness: Awake/alert Behavior During Therapy: WFL for tasks assessed/performed Overall Cognitive Status: Within Functional Limits for tasks assessed                      General Comments      Exercises     Assessment/Plan  PT Assessment Patient needs continued PT services  PT Problem List Decreased strength;Decreased activity tolerance;Decreased balance          PT Treatment Interventions      PT Goals (Current goals can be found in the Care Plan section)  Acute Rehab PT Goals PT Goal Formulation: With patient Time For Goal Achievement: 07/16/16 Potential to Achieve Goals: Good    Frequency 7X/week   Barriers to discharge   Pt does not want to go to a SNF states," it would be horrible and I think I would just decline if I went there."    Co-evaluation               End of Session Equipment Utilized During Treatment: Gait belt Activity Tolerance: Patient limited by fatigue Patient left: with call bell/phone within reach;in bed           Time: 1540-1609 PT Time  Calculation (min) (ACUTE ONLY): 29 min   Charges:   PT Evaluation $PT Eval Moderate Complexity: 1 Procedure     PT G CodesVirgina Organ:      Mikeya Tomasetti, PT CLT [phone]  07/14/2016, 4:09 PM

## 2016-07-14 NOTE — Progress Notes (Signed)
Subjective: She says she feels better. Orthopedic consultation noted and appreciated. She is still short of breath. She's weak. No nausea vomiting diarrhea. No chest pain. She still has pain in her hip where she has a stress fracture  Objective: Vital signs in last 24 hours: Temp:  [97.6 F (36.4 C)-98 F (36.7 C)] 97.6 F (36.4 C) (01/30 0612) Pulse Rate:  [75-86] 75 (01/30 0612) Resp:  [20] 20 (01/30 0612) BP: (110-129)/(44-81) 129/56 (01/30 0612) SpO2:  [93 %-96 %] 96 % (01/30 0612) Weight change:  Last BM Date: 07/11/16  Intake/Output from previous day: 01/29 0701 - 01/30 0700 In: 2475 [P.O.:600; I.V.:1825; IV Piggyback:50] Out: 800 [Urine:800]  PHYSICAL EXAM General appearance: alert, cooperative and Very thin. Heart of hearing. Resp: rhonchi bilaterally Cardio: regular rate and rhythm, S1, S2 normal, no murmur, click, rub or gallop GI: soft, non-tender; bowel sounds normal; no masses,  no organomegaly Extremities: Tender to palpation in the right hip area posteriorly Pupils react. Mucous membranes are moist. Skin warm and dry  Lab Results:  Results for orders placed or performed during the hospital encounter of 07/12/16 (from the past 48 hour(s))  Brain natriuretic peptide     Status: None   Collection Time: 07/12/16  6:12 PM  Result Value Ref Range   B Natriuretic Peptide 89.0 0.0 - 100.0 pg/mL  CBC with Differential     Status: Abnormal   Collection Time: 07/12/16  6:12 PM  Result Value Ref Range   WBC 12.4 (H) 4.0 - 10.5 K/uL   RBC 3.72 (L) 3.87 - 5.11 MIL/uL   Hemoglobin 11.3 (L) 12.0 - 15.0 g/dL   HCT 78.0 (L) 06.3 - 21.5 %   MCV 94.1 78.0 - 100.0 fL   MCH 30.4 26.0 - 34.0 pg   MCHC 32.3 30.0 - 36.0 g/dL   RDW 31.9 81.3 - 16.5 %   Platelets 342 150 - 400 K/uL   Neutrophils Relative % 69 %   Neutro Abs 8.6 (H) 1.7 - 7.7 K/uL   Lymphocytes Relative 21 %   Lymphs Abs 2.6 0.7 - 4.0 K/uL   Monocytes Relative 8 %   Monocytes Absolute 1.0 0.1 - 1.0 K/uL   Eosinophils Relative 2 %   Eosinophils Absolute 0.3 0.0 - 0.7 K/uL   Basophils Relative 0 %   Basophils Absolute 0.0 0.0 - 0.1 K/uL  Basic metabolic panel     Status: Abnormal   Collection Time: 07/12/16  6:12 PM  Result Value Ref Range   Sodium 138 135 - 145 mmol/L   Potassium 3.5 3.5 - 5.1 mmol/L   Chloride 102 101 - 111 mmol/L   CO2 28 22 - 32 mmol/L   Glucose, Bld 107 (H) 65 - 99 mg/dL   BUN 32 (H) 6 - 20 mg/dL   Creatinine, Ser 5.33 (H) 0.44 - 1.00 mg/dL   Calcium 8.6 (L) 8.9 - 10.3 mg/dL   GFR calc non Af Amer 45 (L) >60 mL/min   GFR calc Af Amer 53 (L) >60 mL/min    Comment: (NOTE) The eGFR has been calculated using the CKD EPI equation. This calculation has not been validated in all clinical situations. eGFR's persistently <60 mL/min signify possible Chronic Kidney Disease.    Anion gap 8 5 - 15  Troponin I     Status: None   Collection Time: 07/12/16  6:12 PM  Result Value Ref Range   Troponin I <0.03 <0.03 ng/mL  C-reactive protein     Status:  Abnormal   Collection Time: 07/12/16  6:12 PM  Result Value Ref Range   CRP 16.5 (H) <1.0 mg/dL    Comment: Performed at [facility] [address] [place], [place] [address]  Sedimentation rate     Status: Abnormal   Collection Time: 07/13/16 12:37 AM  Result Value Ref Range   Sed Rate 78 (H) 0 - 22 mm/hr  Culture, blood (routine x 2) Call MD if unable to obtain prior to antibiotics being given     Status: None (Preliminary result)   Collection Time: 07/13/16 12:37 AM  Result Value Ref Range   Specimen Description BLOOD RIGHT ARM    Special Requests BOTTLES DRAWN AEROBIC AND ANAEROBIC 8CC EACH    Culture NO GROWTH 1 DAY    Report Status PENDING   Culture, blood (routine x 2) Call MD if unable to obtain prior to antibiotics being given     Status: None (Preliminary result)   Collection Time: 07/13/16 12:48 AM  Result Value Ref Range   Specimen Description BLOOD RIGHT HAND    Special Requests BOTTLES DRAWN  AEROBIC AND ANAEROBIC 8CC EACH    Culture NO GROWTH 1 DAY    Report Status PENDING   Basic metabolic panel     Status: Abnormal   Collection Time: 07/13/16  5:55 AM  Result Value Ref Range   Sodium 140 135 - 145 mmol/L   Potassium 3.2 (L) 3.5 - 5.1 mmol/L   Chloride 104 101 - 111 mmol/L   CO2 29 22 - 32 mmol/L   Glucose, Bld 138 (H) 65 - 99 mg/dL   BUN 26 (H) 6 - 20 mg/dL   Creatinine, Ser 0.92 0.44 - 1.00 mg/dL   Calcium 7.9 (L) 8.9 - 10.3 mg/dL   GFR calc non Af Amer 58 (L) >60 mL/min   GFR calc Af Amer >60 >60 mL/min    Comment: (NOTE) The eGFR has been calculated using the CKD EPI equation. This calculation has not been validated in all clinical situations. eGFR's persistently <60 mL/min signify possible Chronic Kidney Disease.    Anion gap 7 5 - 15  CBC WITH DIFFERENTIAL     Status: Abnormal   Collection Time: 07/13/16  5:55 AM  Result Value Ref Range   WBC 9.0 4.0 - 10.5 K/uL   RBC 3.18 (L) 3.87 - 5.11 MIL/uL   Hemoglobin 9.7 (L) 12.0 - 15.0 g/dL   HCT 30.1 (L) 36.0 - 46.0 %   MCV 94.7 78.0 - 100.0 fL   MCH 30.5 26.0 - 34.0 pg   MCHC 32.2 30.0 - 36.0 g/dL   RDW 14.6 11.5 - 15.5 %   Platelets 319 150 - 400 K/uL   Neutrophils Relative % 60 %   Neutro Abs 5.4 1.7 - 7.7 K/uL   Lymphocytes Relative 27 %   Lymphs Abs 2.4 0.7 - 4.0 K/uL   Monocytes Relative 10 %   Monocytes Absolute 0.9 0.1 - 1.0 K/uL   Eosinophils Relative 3 %   Eosinophils Absolute 0.2 0.0 - 0.7 K/uL   Basophils Relative 0 %   Basophils Absolute 0.0 0.0 - 0.1 K/uL    ABGS No results for input(s): PHART, PO2ART, TCO2, HCO3 in the last 72 hours.  Invalid input(s): PCO2 CULTURES Recent Results (from the past 240 hour(s))  Culture, blood (routine x 2) Call MD if unable to obtain prior to antibiotics being given     Status: None (Preliminary result)   Collection Time:  07/13/16 12:37 AM  Result Value Ref Range Status   Specimen Description BLOOD RIGHT ARM  Final   Special Requests BOTTLES DRAWN  AEROBIC AND ANAEROBIC 8CC EACH  Final   Culture NO GROWTH 1 DAY  Final   Report Status PENDING  Incomplete  Culture, blood (routine x 2) Call MD if unable to obtain prior to antibiotics being given     Status: None (Preliminary result)   Collection Time: 07/13/16 12:48 AM  Result Value Ref Range Status   Specimen Description BLOOD RIGHT HAND  Final   Special Requests BOTTLES DRAWN AEROBIC AND ANAEROBIC 8CC EACH  Final   Culture NO GROWTH 1 DAY  Final   Report Status PENDING  Incomplete   Studies/Results: Dg Chest 1 View  Result Date: 07/12/2016 CLINICAL DATA:  Productive cough and congestion for 1 week. EXAM: CHEST 1 VIEW COMPARISON:  None. FINDINGS: There is marked hyperinflation. There is patchy opacity in the left base which may represent infectious infiltrate. There is severe emphysematous and fibrotic disease throughout both lungs. No large effusion. Hilar and mediastinal contours are unremarkable. Pulmonary vasculature is normal. No pneumothorax. IMPRESSION: Patchy airspace opacity in the left base may represent pneumonia superimposed on severe COPD. Followup PA and lateral chest X-ray is recommended in 3-4 weeks following trial of antibiotic therapy to ensure resolution and exclude underlying malignancy. Electronically Signed   By: Andreas Newport M.D.   On: 07/12/2016 20:31   Mr Hip Right Wo Contrast  Result Date: 07/13/2016 CLINICAL DATA:  Right hip pain.  No known injury. EXAM: MR OF THE RIGHT HIP WITHOUT CONTRAST TECHNIQUE: Multiplanar, multisequence MR imaging was performed. No intravenous contrast was administered. COMPARISON:  Radiographs dated 07/12/2016 FINDINGS: Bones: There is a subtle stress fracture/reaction in the right sacral ala adjacent to the SI joint. The bones of the right hip are normal. Articular cartilage and labrum Articular cartilage:  Diffuse thinning of the articular cartilage. Labrum:  Intact. Joint or bursal effusion Joint effusion:  None Bursae:  Normal  Muscles and tendons Muscles and tendons: Slight edema in the adductor muscles bilaterally adjacent to the symphysis pubis. This is symmetrical. Other findings Miscellaneous: No adenopathy or mass lesions. Diverticulosis of the distal colon. IMPRESSION: 1. Subtle stress fracture/reaction of the right sacral ala. 2. Nonspecific edema and adductor muscles adjacent to the symphysis pubis bilaterally. Electronically Signed   By: Lorriane Shire M.D.   On: 07/13/2016 12:16   Dg Hip Unilat With Pelvis 2-3 Views Right  Result Date: 07/12/2016 CLINICAL DATA:  81 y/o  F; 3 days of right hip pain without trauma. EXAM: DG HIP (WITH OR WITHOUT PELVIS) 2-3V RIGHT COMPARISON:  None. FINDINGS: Bones are demineralized. Mild osteoarthrosis of hip joints with productive changes of the acetabulum. The no acute fracture or dislocation is identified. Aortic and bilateral iliofemoral atherosclerosis with extensive calcification. IMPRESSION: No acute fracture or dislocation identified. Electronically Signed   By: Kristine Garbe M.D.   On: 07/12/2016 20:33    Medications:  Prior to Admission:  Prescriptions Prior to Admission  Medication Sig Dispense Refill Last Dose  . ALPRAZolam (XANAX) 0.5 MG tablet Take 1 tablet by mouth 4 (four) times daily.   07/11/2016 at Unknown time  . atorvastatin (LIPITOR) 20 MG tablet Take 1 tablet by mouth daily at 6 PM.   07/11/2016 at Unknown time  . donepezil (ARICEPT) 5 MG tablet Take 1 tablet by mouth daily.   07/11/2016 at Unknown time  . HYDROcodone-acetaminophen (NORCO/VICODIN) 5-325 MG tablet  Take 1 tablet by mouth every 6 (six) hours.   07/12/2016 at Unknown time  . mirtazapine (REMERON) 7.5 MG tablet Take 1 tablet by mouth daily.   07/11/2016 at Unknown time   Scheduled: . ALPRAZolam  0.5 mg Oral QID  . atorvastatin  20 mg Oral q1800  . azithromycin  500 mg Oral Q24H  . cefTRIAXone (ROCEPHIN)  IV  1 g Intravenous Q24H  . donepezil  5 mg Oral Daily  . enoxaparin (LOVENOX)  injection  30 mg Subcutaneous Q24H  . feeding supplement (ENSURE ENLIVE)  237 mL Oral BID BM  . HYDROcodone-acetaminophen  1 tablet Oral Q6H  . ipratropium-albuterol  3 mL Nebulization Q6H  . mirtazapine  7.5 mg Oral Daily  . multivitamin with minerals  1 tablet Oral Daily  . predniSONE  40 mg Oral Q breakfast   Continuous: . lactated ringers 75 mL/hr at 07/13/16 2157   HDI:[id number], methocarbamol  Assesment:She has been admitted with community-acquired pneumonia and has acute right hip pain. She is a little bit better. Her pain in the hip appears to be to a sacral insufficiency fracture. She has dementia at baseline but she's pretty well oriented now. She has severe protein calorie malnutrition. Principal Problem:   Hip pain, acute, right Active Problems:   CAP (community acquired pneumonia)   Sacral insufficiency fracture   Protein-calorie malnutrition, severe    Plan: Continue antibiotics. Continue pain meds. PT evaluation.    LOS: 2 days   Eunique Balik L 07/14/2016, 9:18 AM

## 2016-07-15 LAB — URINALYSIS, ROUTINE W REFLEX MICROSCOPIC
Bilirubin Urine: NEGATIVE
Glucose, UA: NEGATIVE mg/dL
Hgb urine dipstick: NEGATIVE
Ketones, ur: NEGATIVE mg/dL
LEUKOCYTES UA: NEGATIVE
NITRITE: NEGATIVE
Protein, ur: NEGATIVE mg/dL
Specific Gravity, Urine: 1.018 (ref 1.005–1.030)
pH: 6 (ref 5.0–8.0)

## 2016-07-15 NOTE — Progress Notes (Signed)
Physical Therapy Treatment Patient Details Name: Melissa Davila MRN: [id number] DOB: 08/15/1935 Today's Date: 07/15/2016    History of Present Illness Melissa Davila is a 81 y.o. female with medical history significant of HLD, mild dementia presenting with right hip pain.  She reports that her daughter made her come.  "Feel real bad, just don't feel well."  5 days of fatigue, difficulty "going".  Right leg pain.  Early dementia.  +cough, nonproductive.  No fever.  No other URI symptoms.  Just very inactive since Thursday.  Hip pain since then.  Just generally immobile, only up to go to restroom twice a day since Friday.  Unable to stand at all.  Washed dishes Thursday afternoon and was in her usual state of health.  No apparent injury, no fall.  Just acute onset of inability to move R hip, but able to move foot    PT Comments    Pt received in bed, and was agreeable to PT tx.  Pt demonstrated bed mobility at modified independent level, and required supervision/min guard for sit<>stand with vc's for hand placement.  She was able to ambulate 6280ft with RW and supervision with no supplemental O2.  After gait, SpO2 was found to be 88%, therefore 2L of O2 re-applied and she was able to improve SpO2 to 90%.  Recommend that she d/c home with HHPT and 24/7 supervision/assistance.  Pt's dtr, Zella BallRobin, called at the end of the tx, and inquired about pt's mobility.  Informed pt that she would not need 24/7 supervision/assistance long term, but would need it for a few weeks.  Zella BallRobin stated that she could take some time off work to proved 24/7 supervision/assistance.     Follow Up Recommendations  Home health PT;Supervision/Assistance - 24 hour     Equipment Recommendations  None recommended by PT    Recommendations for Other Services       Precautions / Restrictions Precautions Precautions: None Restrictions Weight Bearing Restrictions: No    Mobility  Bed Mobility Overal bed mobility: Modified  Independent                Transfers Overall transfer level: Needs assistance Equipment used: Rolling walker (2 wheeled) Transfers: Sit to/from Stand Sit to Stand: Min guard;Supervision         General transfer comment: vc's for hand placement and encouragement to push up from the bed instead of pulling on the RW.   Ambulation/Gait Ambulation/Gait assistance: Supervision Ambulation Distance (Feet): 80 Feet Assistive device: Rolling walker (2 wheeled) Gait Pattern/deviations: Decreased step length - right;Decreased step length - left   Gait velocity interpretation: <1.8 ft/sec, indicative of risk for recurrent falls     Stairs            Wheelchair Mobility    Modified Rankin (Stroke Patients Only)       Balance Overall balance assessment: Needs assistance Sitting-balance support: Bilateral upper extremity supported;Feet supported Sitting balance-Leahy Scale: Good       Standing balance-Leahy Scale: Fair                      Cognition Arousal/Alertness: Awake/alert Behavior During Therapy: WFL for tasks assessed/performed Overall Cognitive Status: Within Functional Limits for tasks assessed                      Exercises      General Comments        Pertinent Vitals/Pain Pain Assessment: No/denies pain  Home Living               Home Equipment: Dan Humphreys - 4 wheels      Prior Function            PT Goals (current goals can now be found in the care plan section) Acute Rehab PT Goals PT Goal Formulation: With patient Time For Goal Achievement: 07/16/16 Potential to Achieve Goals: Good Progress towards PT goals: Progressing toward goals    Frequency    7X/week      PT Plan Current plan remains appropriate    Co-evaluation             End of Session Equipment Utilized During Treatment: Gait belt Activity Tolerance: Patient limited by fatigue Patient left: with call bell/phone within reach;in  bed     Time: 1610-9604 PT Time Calculation (min) (ACUTE ONLY): 37 min  Charges:  $Gait Training: 8-22 mins $Therapeutic Activity: 8-22 mins                    G Codes:  Functional Assessment Tool Used: [employer] "6-clicks"  Functional Limitation: Mobility: Walking and moving around Mobility: Walking and Moving Around Current Status [phone]): At least 20 percent but less than 40 percent impaired, limited or restricted Mobility: Walking and Moving Around Goal Status [phone]): At least 1 percent but less than 20 percent impaired, limited or restricted   Beth Nissim Fleischer, PT, DPT X: [phone]

## 2016-07-15 NOTE — Progress Notes (Signed)
Subjective: She says she feels better. She was able to walk around the hall a little bit. She is still requiring oxygen. She is not on oxygen at home. Her breathing is pretty good. She is still coughing some. No abdominal pain nausea vomiting.  Objective: Vital signs in last 24 hours: Temp:  [97.5 F (36.4 C)-98 F (36.7 C)] 97.6 F (36.4 C) (01/31 0612) Pulse Rate:  [71-100] 71 (01/31 0612) Resp:  [18] 18 (01/31 0612) BP: (121-134)/(49-53) 121/53 (01/31 0612) SpO2:  [93 %-97 %] 97 % (01/31 0612) Weight change:  Last BM Date: 07/11/16  Intake/Output from previous day: 01/30 0701 - 01/31 0700 In: 1510 [P.O.:860; I.V.:650] Out: 500 [Urine:500]  PHYSICAL EXAM General appearance: alert, cooperative and no distress Resp: rhonchi bilaterally Cardio: regular rate and rhythm, S1, S2 normal, no murmur, click, rub or gallop GI: soft, non-tender; bowel sounds normal; no masses,  no organomegaly Extremities: extremities normal, atraumatic, no cyanosis or edema Skin warm and dry. Extremely hard of hearing.  Lab Results:  Results for orders placed or performed during the hospital encounter of 07/12/16 (from the past 48 hour(s))  Strep pneumoniae urinary antigen     Status: None   Collection Time: 07/14/16 12:39 AM  Result Value Ref Range   Strep Pneumo Urinary Antigen NEGATIVE NEGATIVE    Comment:        Infection due to S. pneumoniae cannot be absolutely ruled out since the antigen present may be below the detection limit of the test. Performed at [facility], [address], [place], [place] [address]     ABGS No results for input(s): PHART, PO2ART, TCO2, HCO3 in the last 72 hours.  Invalid input(s): PCO2 CULTURES Recent Results (from the past 240 hour(s))  Culture, blood (routine x 2) Call MD if unable to obtain prior to antibiotics being given     Status: None (Preliminary result)   Collection Time: 07/13/16 12:37 AM  Result Value Ref Range Status   Specimen  Description BLOOD RIGHT ARM  Final   Special Requests BOTTLES DRAWN AEROBIC AND ANAEROBIC 8CC EACH  Final   Culture NO GROWTH 2 DAYS  Final   Report Status PENDING  Incomplete  Culture, blood (routine x 2) Call MD if unable to obtain prior to antibiotics being given     Status: None (Preliminary result)   Collection Time: 07/13/16 12:48 AM  Result Value Ref Range Status   Specimen Description BLOOD RIGHT HAND  Final   Special Requests BOTTLES DRAWN AEROBIC AND ANAEROBIC 8CC EACH  Final   Culture NO GROWTH 2 DAYS  Final   Report Status PENDING  Incomplete   Studies/Results: Mr Hip Right Wo Contrast  Result Date: 07/13/2016 CLINICAL DATA:  Right hip pain.  No known injury. EXAM: MR OF THE RIGHT HIP WITHOUT CONTRAST TECHNIQUE: Multiplanar, multisequence MR imaging was performed. No intravenous contrast was administered. COMPARISON:  Radiographs dated 07/12/2016 FINDINGS: Bones: There is a subtle stress fracture/reaction in the right sacral ala adjacent to the SI joint. The bones of the right hip are normal. Articular cartilage and labrum Articular cartilage:  Diffuse thinning of the articular cartilage. Labrum:  Intact. Joint or bursal effusion Joint effusion:  None Bursae:  Normal Muscles and tendons Muscles and tendons: Slight edema in the adductor muscles bilaterally adjacent to the symphysis pubis. This is symmetrical. Other findings Miscellaneous: No adenopathy or mass lesions. Diverticulosis of the distal colon. IMPRESSION: 1. Subtle stress fracture/reaction of the right sacral ala. 2. Nonspecific  edema and adductor muscles adjacent to the symphysis pubis bilaterally. Electronically Signed   By: Francene BoyersJames  Maxwell M.D.   On: 07/13/2016 12:16    Medications:  Prior to Admission:  Prescriptions Prior to Admission  Medication Sig Dispense Refill Last Dose  . ALPRAZolam (XANAX) 0.5 MG tablet Take 1 tablet by mouth 4 (four) times daily.   07/11/2016 at Unknown time  . atorvastatin (LIPITOR) 20 MG  tablet Take 1 tablet by mouth daily at 6 PM.   07/11/2016 at Unknown time  . donepezil (ARICEPT) 5 MG tablet Take 1 tablet by mouth daily.   07/11/2016 at Unknown time  . HYDROcodone-acetaminophen (NORCO/VICODIN) 5-325 MG tablet Take 1 tablet by mouth every 6 (six) hours.   07/12/2016 at Unknown time  . mirtazapine (REMERON) 7.5 MG tablet Take 1 tablet by mouth daily.   07/11/2016 at Unknown time   Scheduled: . ALPRAZolam  0.5 mg Oral BID  . atorvastatin  20 mg Oral q1800  . azithromycin  500 mg Oral Q24H  . cefTRIAXone (ROCEPHIN)  IV  1 g Intravenous Q24H  . donepezil  5 mg Oral Daily  . enoxaparin (LOVENOX) injection  30 mg Subcutaneous Q24H  . feeding supplement (ENSURE ENLIVE)  237 mL Oral BID BM  . HYDROcodone-acetaminophen  1 tablet Oral Q6H  . ipratropium-albuterol  3 mL Nebulization TID  . mirtazapine  7.5 mg Oral Daily  . multivitamin with minerals  1 tablet Oral Daily  . predniSONE  40 mg Oral Q breakfast   Continuous: . lactated ringers 75 mL/hr at 07/14/16 2250   JXB:[id number]:albuterol, methocarbamol  Assesment:She was admitted with community-acquired pneumonia and has a sacral insufficiency fracture. She has severe protein calorie malnutrition at baseline. She is better and I think she'll be ready for discharge with home health services tomorrow Principal Problem:   Hip pain, acute, right Active Problems:   CAP (community acquired pneumonia)   Sacral insufficiency fracture   Protein-calorie malnutrition, severe    Plan: As above    LOS: 3 days   Jovaun Levene L 07/15/2016, 9:08 AM

## 2016-07-16 MED ORDER — CEPHALEXIN 500 MG PO CAPS: 500.0000 mg | ORAL_CAPSULE | Freq: Three times a day (TID) | ORAL | 0 refills | Status: DC

## 2016-07-16 NOTE — Progress Notes (Signed)
She is much improved. She says she feels better. Her breathing is okay. She is still wearing nasal oxygen and may require that at home that will be checked. She was able to ambulate better and her recommendation now is that she not go to skilled care facility but go home with home health PT and that will be arranged today

## 2016-07-16 NOTE — Progress Notes (Addendum)
Physical Therapy Treatment Patient Details Name: Melissa Davila MRN: [id number] DOB: 02/10/1936 Today's Date: 07/16/2016    History of Present Illness Melissa BogaJulia O Davila is a 81 y.o. female with medical history significant of HLD, mild dementia presenting with right hip pain.  She reports that her daughter made her come.  "Feel real bad, just don't feel well."  5 days of fatigue, difficulty "going".  Right leg pain.  Early dementia.  +cough, nonproductive.  No fever.  No other URI symptoms.  Just very inactive since Thursday.  Hip pain since then.  Just generally immobile, only up to go to restroom twice a day since Friday.  Unable to stand at all.  Washed dishes Thursday afternoon and was in her usual state of health.  No apparent injury, no fall.  Just acute onset of inability to move R hip, but able to move foot    PT Comments    Pt sitting on EOB at PTA entrance, pleasant and willing to participate with therapy today.  Pt stated her pain scale 5/10 and pre-medicated prior session.  Min guard and cueing for proper hand placment with sit to stand and min guard with gait training utilizing RW. EOS pt left in chair with family in room.  No reports of increased pain at EOS.       Follow Up Recommendations  Home health PT;Supervision/Assistance - 24 hour     Equipment Recommendations  None recommended by PT    Recommendations for Other Services       Precautions / Restrictions Precautions Precautions: None Restrictions Weight Bearing Restrictions: No    Mobility  Bed Mobility               General bed mobility comments: sitting on EOB upon entrance  Transfers Overall transfer level: Needs assistance Equipment used: Rolling walker (2 wheeled) Transfers: Sit to/from Stand Sit to Stand: Min guard;Supervision         General transfer comment: vc's for hand placement and encouragement to push up from the bed instead of pulling on the RW.   Ambulation/Gait Ambulation/Gait  assistance: Supervision Ambulation Distance (Feet): 80 Feet Assistive device: Rolling walker (2 wheeled) Gait Pattern/deviations: Decreased step length - right;Decreased step length - left   Gait velocity interpretation: <1.8 ft/sec, indicative of risk for recurrent falls     Stairs            Wheelchair Mobility    Modified Rankin (Stroke Patients Only)       Balance                                    Cognition Arousal/Alertness: Awake/alert Behavior During Therapy: WFL for tasks assessed/performed Overall Cognitive Status: Within Functional Limits for tasks assessed                      Exercises      General Comments        Pertinent Vitals/Pain Pain Assessment: 0-10 Pain Score: 5  Pain Location: hip Pain Descriptors / Indicators: Sore;Aching    Home Living                      Prior Function            PT Goals (current goals can now be found in the care plan section)      Frequency    7X/week  PT Plan Current plan remains appropriate    Co-evaluation             End of Session Equipment Utilized During Treatment: Gait belt Activity Tolerance: Patient tolerated treatment well;Patient limited by fatigue;No increased pain Patient left: in chair;with call bell/phone within reach;with family/visitor present     Time: 6962-9528 PT Time Calculation (min) (ACUTE ONLY): 29 min  Charges:  $Gait Training: 8-22 mins $Therapeutic Activity: 8-22 mins                    G Codes:     Becky Sax, LPTA; CBIS [phone]  Juel Burrow 07/16/2016, 10:27 AM

## 2016-07-16 NOTE — Progress Notes (Signed)
SATURATION QUALIFICATIONS: (This note is used to comply with regulatory documentation for home oxygen)  Patient Saturations on Room Air at Rest = 90%  Patient Saturations on Room Air while Ambulating = 88%  Patient Saturations on 2 Liters of oxygen while Ambulating = 93%  Please briefly explain why patient needs home oxygen: 

## 2016-07-16 NOTE — Care Management Important Message (Signed)
Important Message  Patient Details  Name: Melissa Davila MRN: [id number] Date of Birth: 12/01/1935   Medicare Important Message Given:  Yes    Malcolm MetroChildress, Inis Borneman Demske, RN 07/16/2016, 1:39 PM

## 2016-07-16 NOTE — Care Management Note (Signed)
Case Management Note  Patient Details  Name: Melissa Davila MRN: [id number] Date of Birth: 08/09/1935  Subjective/Objective:                  Pt admitted with hip pain, also has component of COPD. Pt is from home, lives alone and has family who check on her often. Pt uses rollator for mobility. She [facility] and [facility]. [facility]. Melissa BailiffLinda Lothian, [facility], aware of referral and will obtain pt info from chart. Pt will have port oxygen tank delivered to room prior to DC. Pt understands HH has 48hrs to make first visit.   Action/Plan: Pt discharging home today with HH nursing/pt and supplemental oxygen.   Expected Discharge Date:  07/16/16               Expected Discharge Plan:  Home w Home Health Services  In-House Referral:  Clinical Social Work  Discharge planning Services  CM Consult  Post Acute Care Choice:  Home Health, Durable Medical Equipment Choice offered to:  Patient  DME Arranged:  Oxygen DME Agency:  [facility]  HH Arranged:  [facility], [facility]  [facility]  Status of Service:  Completed, signed off  Malcolm MetroChildress, Kendan Cornforth Demske, RN 07/16/2016, 1:34 PM

## 2016-07-17 NOTE — Discharge Summary (Signed)
Physician Discharge Summary  Patient ID: Melissa Davila MRN: [id number] DOB/AGE: Mar 04, 1936 81 y.o. Primary Care Physician:Davelyn Gwinn L, MD Admit date: 07/12/2016 Discharge date: 07/17/2016    Discharge Diagnoses:   Principal Problem:   Hip pain, acute, right Active Problems:   CAP (community acquired pneumonia)   Sacral insufficiency fracture   Protein-calorie malnutrition, severe COPD Dementia Acute hypoxic respiratory failure Anxiety Hyperlipidemia  Allergies as of 07/16/2016   No Known Allergies     Medication List    TAKE these medications   ALPRAZolam 0.5 MG tablet Commonly known as:  XANAX Take 1 tablet by mouth 4 (four) times daily.   atorvastatin 20 MG tablet Commonly known as:  LIPITOR Take 1 tablet by mouth daily at 6 PM.   cephALEXin 500 MG capsule Commonly known as:  KEFLEX Take 1 capsule (500 mg total) by mouth 3 (three) times daily.   donepezil 5 MG tablet Commonly known as:  ARICEPT Take 1 tablet by mouth daily.   HYDROcodone-acetaminophen 5-325 MG tablet Commonly known as:  NORCO/VICODIN Take 1 tablet by mouth every 6 (six) hours.   mirtazapine 7.5 MG tablet Commonly known as:  REMERON Take 1 tablet by mouth daily.       Discharged Condition:Improved    Consults: None  Significant Diagnostic Studies: Dg Chest 1 View  Result Date: 07/12/2016 CLINICAL DATA:  Productive cough and congestion for 1 week. EXAM: CHEST 1 VIEW COMPARISON:  None. FINDINGS: There is marked hyperinflation. There is patchy opacity in the left base which may represent infectious infiltrate. There is severe emphysematous and fibrotic disease throughout both lungs. No large effusion. Hilar and mediastinal contours are unremarkable. Pulmonary vasculature is normal. No pneumothorax. IMPRESSION: Patchy airspace opacity in the left base may represent pneumonia superimposed on severe COPD. Followup PA and lateral chest X-ray is recommended in 3-4 weeks following trial of  antibiotic therapy to ensure resolution and exclude underlying malignancy. Electronically Signed   By: Ellery Plunk M.D.   On: 07/12/2016 20:31   Mr Hip Right Wo Contrast  Result Date: 07/13/2016 CLINICAL DATA:  Right hip pain.  No known injury. EXAM: MR OF THE RIGHT HIP WITHOUT CONTRAST TECHNIQUE: Multiplanar, multisequence MR imaging was performed. No intravenous contrast was administered. COMPARISON:  Radiographs dated 07/12/2016 FINDINGS: Bones: There is a subtle stress fracture/reaction in the right sacral ala adjacent to the SI joint. The bones of the right hip are normal. Articular cartilage and labrum Articular cartilage:  Diffuse thinning of the articular cartilage. Labrum:  Intact. Joint or bursal effusion Joint effusion:  None Bursae:  Normal Muscles and tendons Muscles and tendons: Slight edema in the adductor muscles bilaterally adjacent to the symphysis pubis. This is symmetrical. Other findings Miscellaneous: No adenopathy or mass lesions. Diverticulosis of the distal colon. IMPRESSION: 1. Subtle stress fracture/reaction of the right sacral ala. 2. Nonspecific edema and adductor muscles adjacent to the symphysis pubis bilaterally. Electronically Signed   By: Francene Boyers M.D.   On: 07/13/2016 12:16   Dg Hip Unilat With Pelvis 2-3 Views Right  Result Date: 07/12/2016 CLINICAL DATA:  81 y/o  F; 3 days of right hip pain without trauma. EXAM: DG HIP (WITH OR WITHOUT PELVIS) 2-3V RIGHT COMPARISON:  None. FINDINGS: Bones are demineralized. Mild osteoarthrosis of hip joints with productive changes of the acetabulum. The no acute fracture or dislocation is identified. Aortic and bilateral iliofemoral atherosclerosis with extensive calcification. IMPRESSION: No acute fracture or dislocation identified. Electronically Signed   By: Mitzi Hansen  M.D.   On: 07/12/2016 20:33    Lab Results: Basic Metabolic Panel: No results for input(s): NA, K, CL, CO2, GLUCOSE, BUN, CREATININE,  CALCIUM, MG, PHOS in the last 72 hours. Liver Function Tests: No results for input(s): AST, ALT, ALKPHOS, BILITOT, PROT, ALBUMIN in the last 72 hours.   CBC: No results for input(s): WBC, NEUTROABS, HGB, HCT, MCV, PLT in the last 72 hours.  Recent Results (from the past 240 hour(s))  Culture, blood (routine x 2) Call MD if unable to obtain prior to antibiotics being given     Status: None (Preliminary result)   Collection Time: 07/13/16 12:37 AM  Result Value Ref Range Status   Specimen Description BLOOD RIGHT ARM  Final   Special Requests BOTTLES DRAWN AEROBIC AND ANAEROBIC 8CC EACH  Final   Culture NO GROWTH 3 DAYS  Final   Report Status PENDING  Incomplete  Culture, blood (routine x 2) Call MD if unable to obtain prior to antibiotics being given     Status: None (Preliminary result)   Collection Time: 07/13/16 12:48 AM  Result Value Ref Range Status   Specimen Description BLOOD RIGHT HAND  Final   Special Requests BOTTLES DRAWN AEROBIC AND ANAEROBIC 8CC EACH  Final   Culture NO GROWTH 3 DAYS  Final   Report Status PENDING  Incomplete     Hospital Course: This is a 81 year old who had been in her usual state of poor health at home when she developed severe back and hip pain on the right. She came to the emergency department she was found to have pneumonia. At baseline she has COPD. She also has dementia. She has malnutrition at baseline. She was treated with antibiotics had MRI of the hip and was found to have a insufficiency fracture in the sacrum. She had PT consultation and orthopedic consultation. Time of discharge her breathing was much better pain was in control but she was hypoxic and was sent home on oxygen  Discharge Exam: Blood pressure (!) 159/58, pulse 86, temperature 98 F (36.7 C), temperature source Oral, resp. rate 18, height 5\' 6"  (1.676 m), weight 36.5 kg (80 lb 7.5 oz), SpO2 93 %. She is awake and alert. Mildly confused. Chest clear. Heart regular  Disposition:  Home with home health services  Discharge Instructions    Call MD for:  persistant nausea and vomiting    Complete by:  As directed    Call MD for:  temperature >100.4    Complete by:  As directed    Diet - low sodium heart healthy    Complete by:  As directed    Increase activity slowly    Complete by:  As directed         Signed: Nautika Cressey L   07/17/2016, 6:50 AM

## 2016-07-18 LAB — CULTURE, BLOOD (ROUTINE X 2)
CULTURE: NO GROWTH
Culture: NO GROWTH

## 2016-12-19 ENCOUNTER — Encounter (HOSPITAL_COMMUNITY): Payer: Self-pay

## 2016-12-19 ENCOUNTER — Emergency Department (HOSPITAL_COMMUNITY)
Admission: EM | Admit: 2016-12-19 | Discharge: 2016-12-19 | Disposition: A | Discharge status: Home / Self Care | Payer: Medicare PPO | Attending: Emergency Medicine | Admitting: Emergency Medicine

## 2016-12-19 ENCOUNTER — Emergency Department (HOSPITAL_COMMUNITY): Payer: Medicare PPO

## 2016-12-19 DIAGNOSIS — J449 Chronic obstructive pulmonary disease, unspecified: Secondary | ICD-10-CM | POA: Diagnosis not present

## 2016-12-19 DIAGNOSIS — Z79899 Other long term (current) drug therapy: Secondary | ICD-10-CM | POA: Diagnosis not present

## 2016-12-19 DIAGNOSIS — G309 Alzheimer's disease, unspecified: Secondary | ICD-10-CM | POA: Insufficient documentation

## 2016-12-19 DIAGNOSIS — M25562 Pain in left knee: Secondary | ICD-10-CM | POA: Diagnosis present

## 2016-12-19 DIAGNOSIS — Z87891 Personal history of nicotine dependence: Secondary | ICD-10-CM | POA: Insufficient documentation

## 2016-12-19 DIAGNOSIS — M25552 Pain in left hip: Secondary | ICD-10-CM | POA: Diagnosis not present

## 2016-12-19 HISTORY — DX: Chronic obstructive pulmonary disease, unspecified: J44.9

## 2016-12-19 NOTE — ED Provider Notes (Signed)
[facility]-EMERGENCY DEPT Provider Note   CSN: [id number] Arrival date & time: 12/19/16  1400     History   Chief Complaint Chief Complaint  Patient presents with  . Fall  . Knee Pain    HPI Melissa Davila is a 81 y.o. female.  Patient with history of COPD on home oxygen and Alzheimer's dementia presents with left knee pain for 1 week. Patient fell out of bed. Patient did not be seen at the hospital. Patient's had persistent pain since then making difficult for her to ambulate with her walker. Patient denies a significant hip discomfort. No other injuries.       Past Medical History:  Diagnosis Date  . Alzheimer's dementia   . Anxiety   . Arthritis   . COPD (chronic obstructive pulmonary disease) (HCC)   . Hypercholesterolemia     Patient Active Problem List   Diagnosis Date Noted  . Protein-calorie malnutrition, severe 07/14/2016  . Sacral insufficiency fracture   . CAP (community acquired pneumonia) 07/12/2016  . Hip pain, acute, right 07/12/2016    Past Surgical History:  Procedure Laterality Date  . BLADDER REPAIR      OB History    No data available       Home Medications    Prior to Admission medications   Medication Sig Start Date End Date Taking? Authorizing Provider  ALPRAZolam Prudy Feeler) 0.5 MG tablet Take 1 tablet by mouth 4 (four) times daily. 06/17/16   [provider]  atorvastatin (LIPITOR) 20 MG tablet Take 1 tablet by mouth daily at 6 PM. 06/23/16   [provider]  cephALEXin (KEFLEX) 500 MG capsule Take 1 capsule (500 mg total) by mouth 3 (three) times daily. 07/16/16   Kari Baars, MD  donepezil (ARICEPT) 5 MG tablet Take 1 tablet by mouth daily. 07/10/16   [provider]  HYDROcodone-acetaminophen (NORCO/VICODIN) 5-325 MG tablet Take 1 tablet by mouth every 6 (six) hours. 06/17/16   [provider]  mirtazapine (REMERON) 7.5 MG tablet Take 1 tablet by mouth daily. 07/10/16   [provider]    Family  History No family history on file.  Social History Social History  Substance Use Topics  . Smoking status: Former Smoker    Packs/day: 1.00    Years: 50.00    Quit date: 06/2015  . Smokeless tobacco: Never Used  . Alcohol use No     Allergies   Patient has no known allergies.   Review of Systems Review of Systems  Constitutional: Negative for chills and fever.  Respiratory: Negative for shortness of breath.   Cardiovascular: Negative for chest pain.  Gastrointestinal: Negative for abdominal pain and vomiting.  Musculoskeletal: Positive for gait problem. Negative for back pain, neck pain and neck stiffness.  Skin: Negative for rash.  Neurological: Negative for light-headedness and headaches.     Physical Exam Updated Vital Signs BP 131/60 (BP Location: Right Arm)   Pulse 73   Temp (!) 97.3 F (36.3 C) (Oral)   Resp 15   Ht 5\' 6"  (1.676 m)   Wt 38.6 kg (85 lb)   LMP  (LMP Unknown)   SpO2 99%   BMI 13.72 kg/m   Physical Exam  Constitutional: She appears well-developed.  HENT:  Head: Normocephalic.  Cardiovascular: Normal rate.   Musculoskeletal: She exhibits tenderness. She exhibits no deformity.  Patient has mild discomfort to palpation of left anterior patella and lateral proximal femur on the left. Patient does have flexion extension ability  of left knee and left hip without edema that left leg. Patient does not have an effusion to left knee or hip. No obvious ligament laxity in the left knee.  Neurological: She is alert.  Skin: Skin is warm. Capillary refill takes less than 2 seconds.  Psychiatric:  Mild dementia  Nursing note and vitals reviewed.    ED Treatments / Results  Labs (all labs ordered are listed, but only abnormal results are displayed) Labs Reviewed - No data to display  EKG  EKG Interpretation None       Radiology No results found.  Procedures Procedures (including critical care time)  Medications Ordered in ED Medications  - No data to display   Initial Impression / Assessment and Plan / ED Course  I have reviewed the triage vital signs and the nursing notes.  Pertinent labs & imaging results that were available during my care of the patient were reviewed by me and considered in my medical decision making (see chart for details).    Patient presents 1 week after low risk fall. Patient does have chronic medical problems, mild dementia and malnourished/frail on exam. Plan for x-rays. Patient had pain meds prior to arrival. Xray no fx, CT ordered for further details, unable to obtain MRI at this time.  Signed out for likely close follow up.   Medications - No data to display  Vitals:   12/19/16 1413  BP: 131/60  Pulse: 73  Resp: 15  Temp: (!) 97.3 F (36.3 C)  TempSrc: Oral  SpO2: 99%  Weight: 38.6 kg (85 lb)  Height: 5\' 6"  (1.676 m)    Final diagnoses:  Left hip pain    Final Clinical Impressions(s) / ED Diagnoses   Final diagnoses:  Left hip pain    New Prescriptions New Prescriptions   No medications on file     Blane OharaZavitz, Amoura Ransier, MD 12/20/16 [phone]

## 2016-12-19 NOTE — Discharge Instructions (Signed)
Tylenol or ibuprofen for pain Ice and elevate Rest your hip

## 2016-12-19 NOTE — ED Provider Notes (Signed)
I discussed the patient's findings with both the patient and her family member with her agreement. The patient has no findings of fracture on CT scan of the hip. She otherwise appears well, I have offered her home health services and we'll try to arrange for physical therapy early this week. She has been instructed to share these findings with her family doctor in follow-up. She and her family member are in agreement with the plan. They will be given a prescription for a wheelchair in the meantime to help her get around the house.   Melissa Davila, BrianEber Hong, MD 12/19/16 [phone]

## 2016-12-19 NOTE — ED Triage Notes (Signed)
Patient reports of falling out of bed while sleeping 1 week ago. Complains of left knee pain. Denies loc, neck or back pain.   Patient on home oxygen at 2 LPM.

## 2017-04-15 DEATH — deceased

## 2018-03-09 IMAGING — CT CT HIP*L* W/O CM
2 of 3 series · 17 of 46 positions shown, 19 images · non-contrast
Comparison: Radiographs from 12/19/2016

CLINICAL DATA: Left hip pain.  Fall from bed 1 week ago.

EXAM:
CT OF THE LEFT HIP WITHOUT CONTRAST
TECHNIQUE: Multidetector CT imaging of the left hip was performed according to
the standard protocol. Multiplanar CT image reconstructions were
also generated.

[Series 3: axial st · axial · 0.31mm/px · z∈[-279,-157]mm · 14 of 71 slices shown, 16 images]
[im 5/71  soft-tissue]
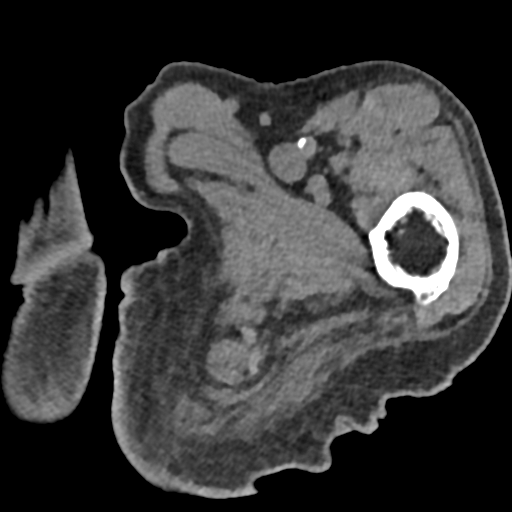
[im 5/71  bone]
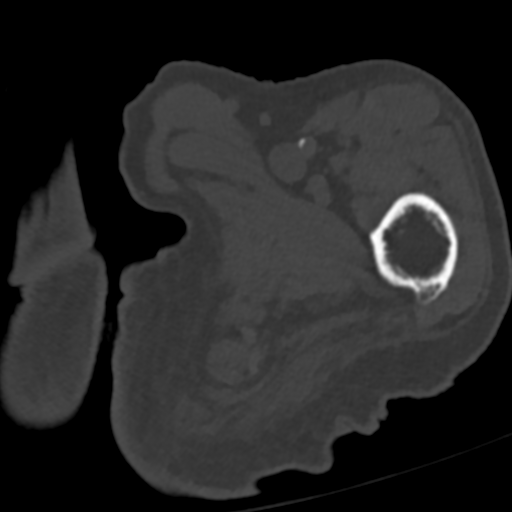
[im 10/71  soft-tissue]
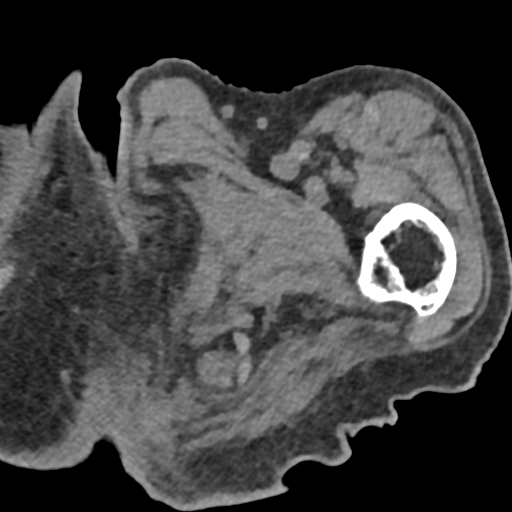
[im 14/71  soft-tissue]
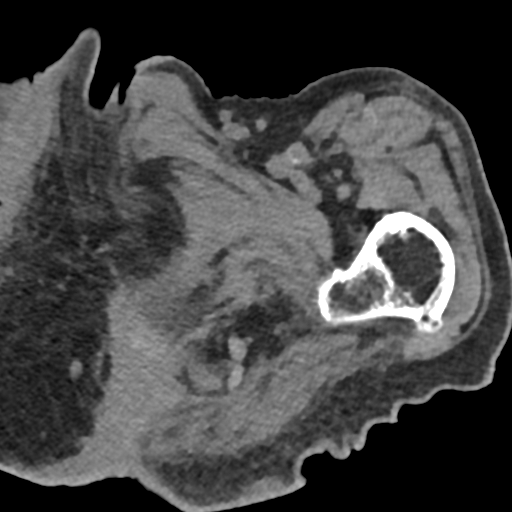
[im 19/71  soft-tissue]
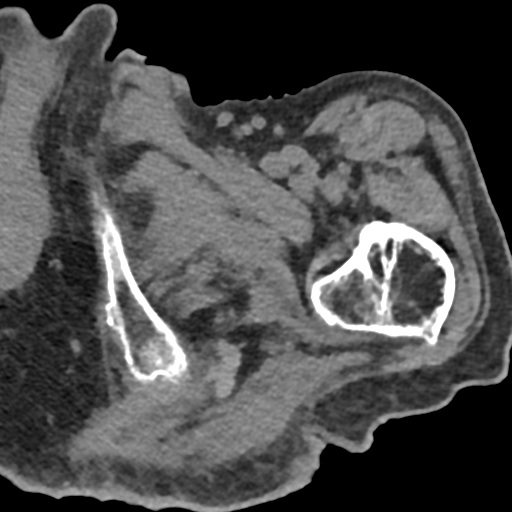
[im 23/71  soft-tissue]
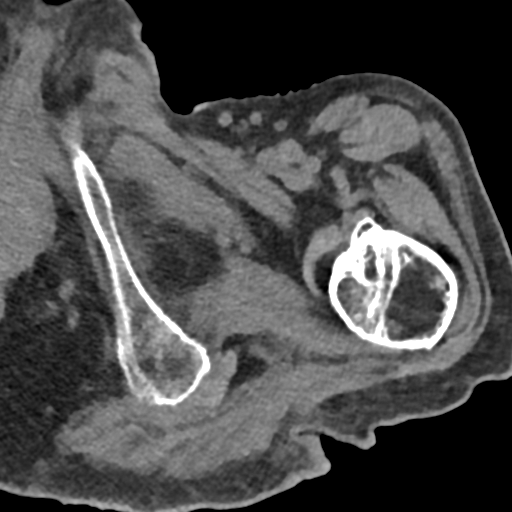
[im 28/71  soft-tissue]
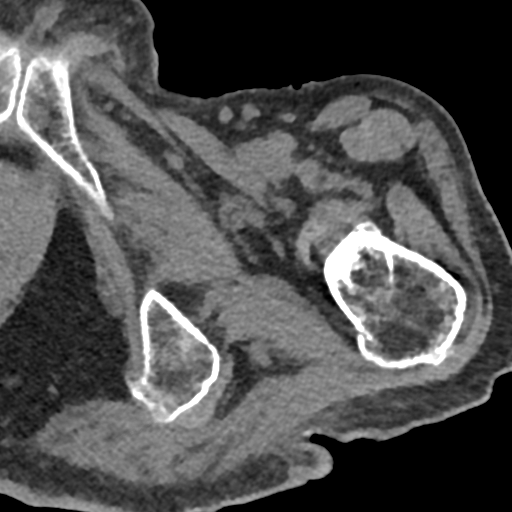
[im 32/71  soft-tissue]
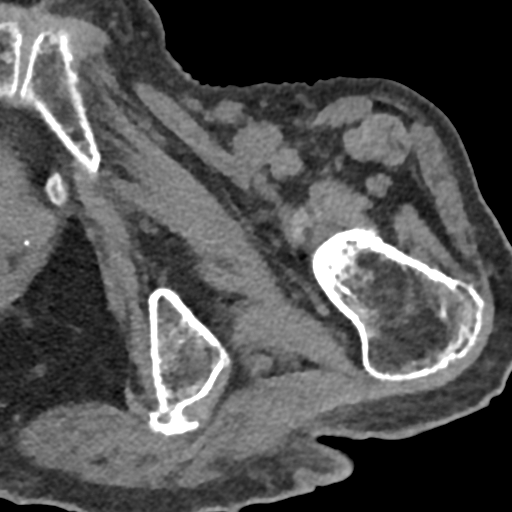
[im 39/71  soft-tissue]
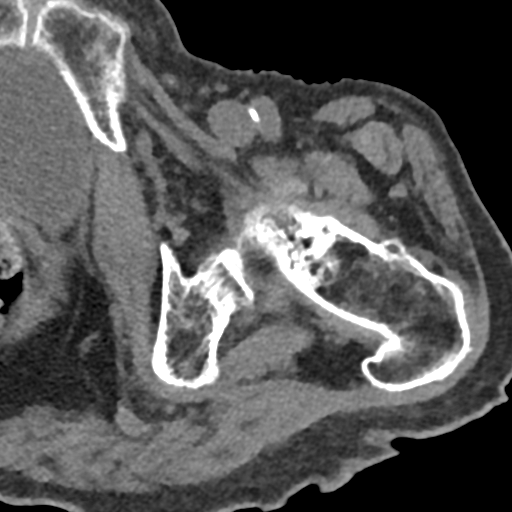
[im 43/71  soft-tissue]
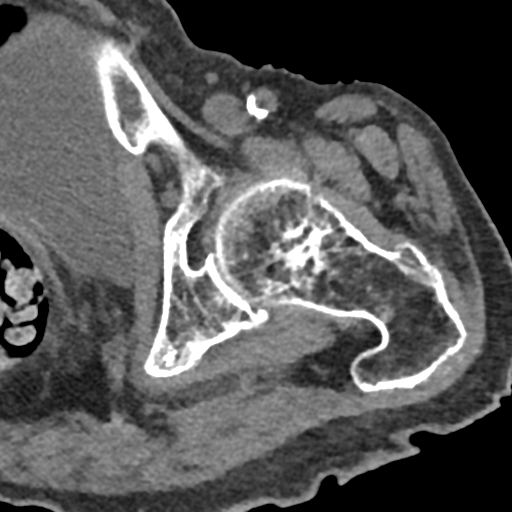
[im 43/71  bone]
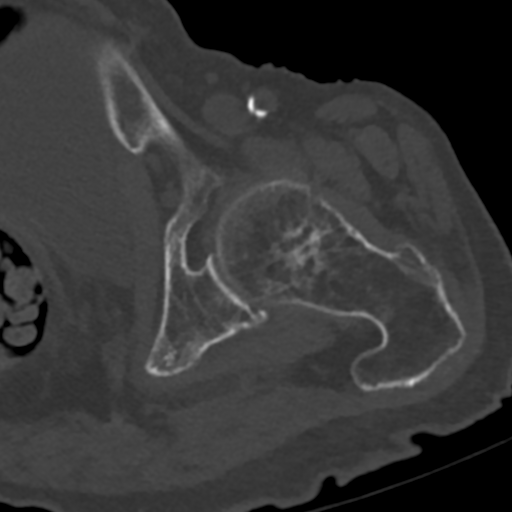
[im 48/71  soft-tissue]
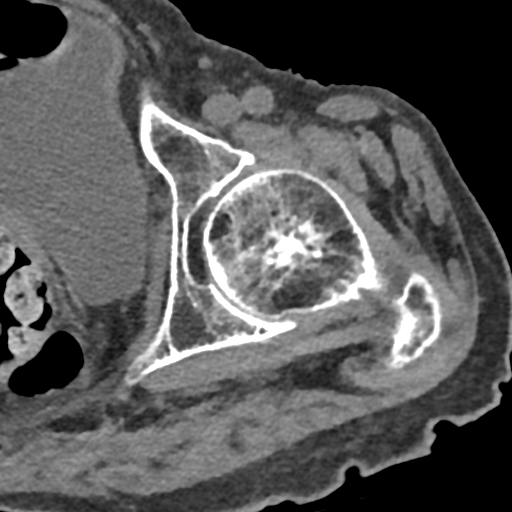
[im 52/71  soft-tissue]
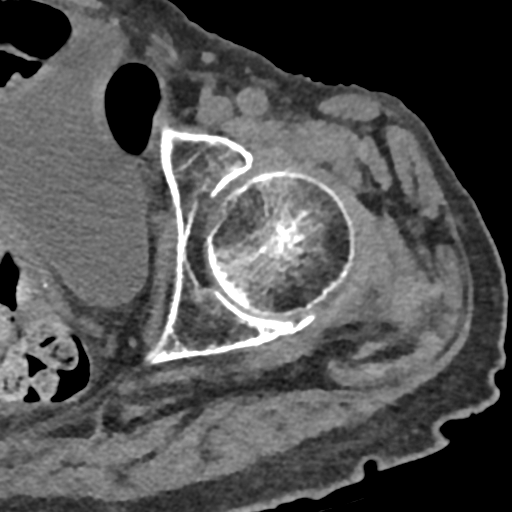
[im 57/71  soft-tissue]
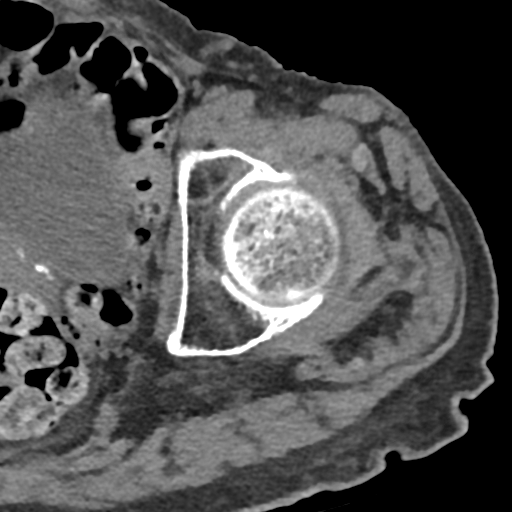
[im 61/71  soft-tissue]
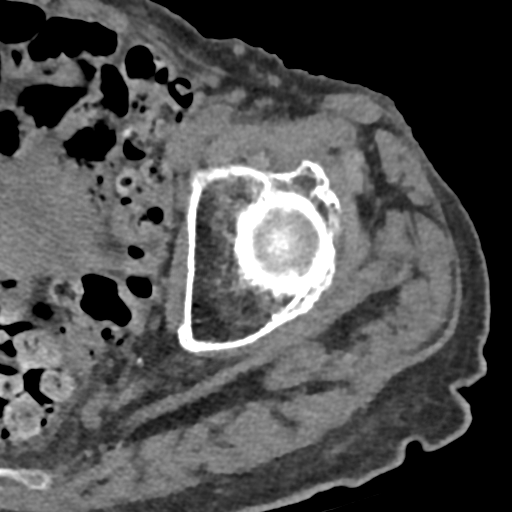
[im 66/71  soft-tissue]
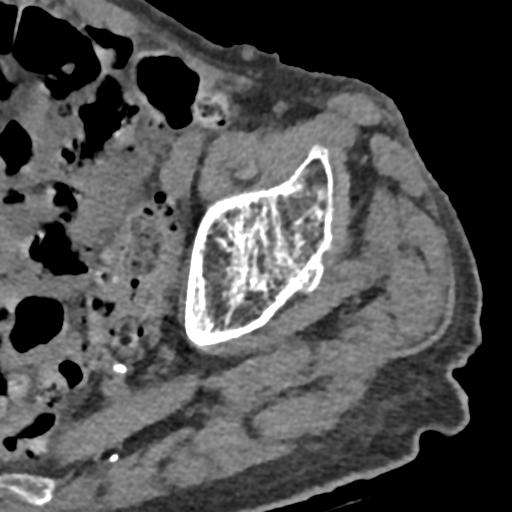

[Series 8: coronal st · coronal · 0.31mm/px · 3 of 84 slices shown]
[im 28/84  soft-tissue]
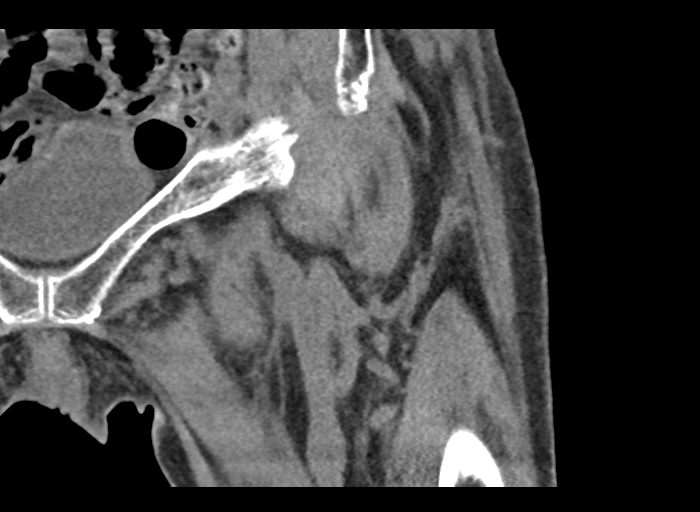
[im 37/84  soft-tissue]
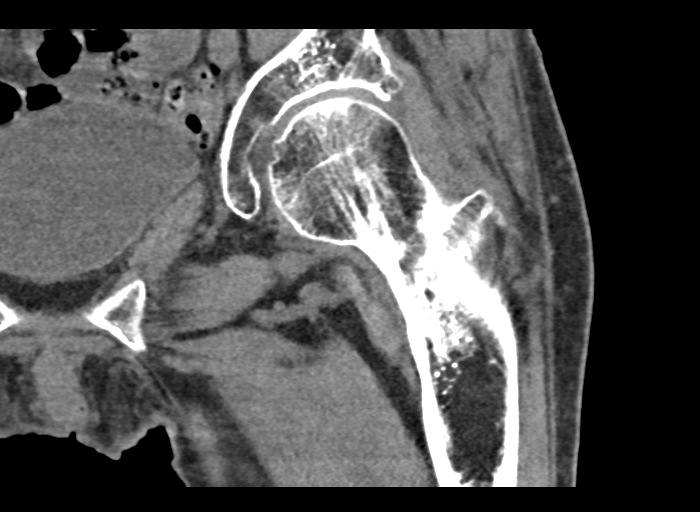
[im 47/84  soft-tissue]
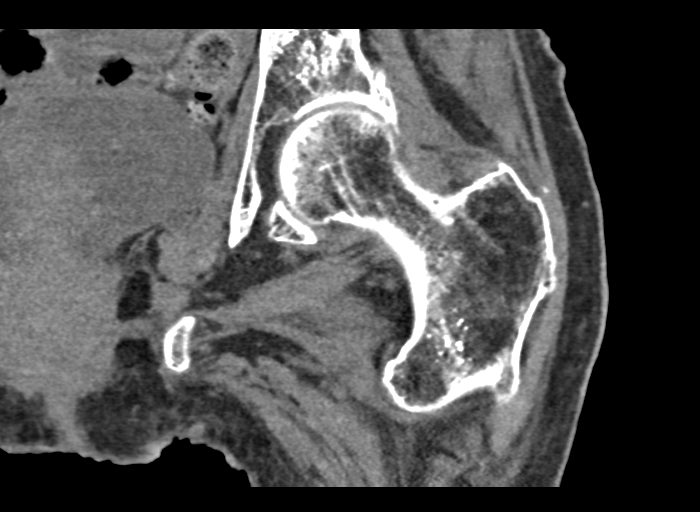

[17 of 46 positions shown; findings below may reference images not displayed]

FINDINGS: No visible fracture the left proximal femur or the adjacent bony
pelvis. Spurring of the left acetabulum and greater trochanter. No
hip joint effusion identified.

Sigmoid colon diverticulosis.  Prominence of stool in the rectum.
IMPRESSION: 1. CT does not reveal the source of the patient's left hip pain. No
fracture, hip effusion, or associated acute bony findings are
identified.
2. Sigmoid colon diverticulosis, with prominence of stool in the
rectal vault.
